# Patient Record
Sex: Male | Born: 1975 | Race: Black or African American | Hispanic: No | Marital: Married | Smoking: Never smoker
Health system: Southern US, Community
[De-identification: ages and names within clinical notes are randomized; demographics above are authoritative.]

## PROBLEM LIST (undated history)

## (undated) DIAGNOSIS — Z992 Dependence on renal dialysis: Secondary | ICD-10-CM

## (undated) DIAGNOSIS — N183 Chronic kidney disease, stage 3 (moderate): Secondary | ICD-10-CM

## (undated) DIAGNOSIS — I10 Essential (primary) hypertension: Secondary | ICD-10-CM

## (undated) DIAGNOSIS — I639 Cerebral infarction, unspecified: Secondary | ICD-10-CM

---

## 2003-02-07 ENCOUNTER — Emergency Department (HOSPITAL_COMMUNITY): Admission: EM | Admit: 2003-02-07 | Discharge: 2003-02-07 | Payer: Self-pay | Admitting: Emergency Medicine

## 2003-02-07 ENCOUNTER — Encounter: Payer: Self-pay | Admitting: Emergency Medicine

## 2006-12-03 ENCOUNTER — Emergency Department: Payer: Self-pay | Admitting: Emergency Medicine

## 2006-12-31 ENCOUNTER — Encounter: Payer: Self-pay | Admitting: Family Medicine

## 2007-01-20 ENCOUNTER — Encounter: Payer: Self-pay | Admitting: Family Medicine

## 2013-05-21 DIAGNOSIS — I10 Essential (primary) hypertension: Secondary | ICD-10-CM

## 2013-05-21 HISTORY — DX: Essential (primary) hypertension: I10

## 2014-03-13 ENCOUNTER — Emergency Department: Payer: Self-pay | Admitting: Emergency Medicine

## 2014-03-13 LAB — RAPID INFLUENZA A&B ANTIGENS

## 2014-05-21 DIAGNOSIS — I639 Cerebral infarction, unspecified: Secondary | ICD-10-CM

## 2014-05-21 HISTORY — DX: Cerebral infarction, unspecified: I63.9

## 2016-12-13 ENCOUNTER — Emergency Department (HOSPITAL_COMMUNITY): Payer: Self-pay

## 2016-12-13 ENCOUNTER — Inpatient Hospital Stay (HOSPITAL_COMMUNITY)
Admission: EM | Admit: 2016-12-13 | Discharge: 2016-12-16 | DRG: 194 | Disposition: A | Discharge status: Home / Self Care | Payer: Self-pay | Attending: Internal Medicine | Admitting: Internal Medicine

## 2016-12-13 ENCOUNTER — Encounter (HOSPITAL_COMMUNITY): Payer: Self-pay | Admitting: Emergency Medicine

## 2016-12-13 DIAGNOSIS — N183 Chronic kidney disease, stage 3 (moderate): Secondary | ICD-10-CM | POA: Diagnosis present

## 2016-12-13 DIAGNOSIS — N184 Chronic kidney disease, stage 4 (severe): Secondary | ICD-10-CM | POA: Diagnosis present

## 2016-12-13 DIAGNOSIS — Z9114 Patient's other noncompliance with medication regimen: Secondary | ICD-10-CM

## 2016-12-13 DIAGNOSIS — N179 Acute kidney failure, unspecified: Secondary | ICD-10-CM | POA: Insufficient documentation

## 2016-12-13 DIAGNOSIS — D649 Anemia, unspecified: Secondary | ICD-10-CM | POA: Diagnosis present

## 2016-12-13 DIAGNOSIS — I129 Hypertensive chronic kidney disease with stage 1 through stage 4 chronic kidney disease, or unspecified chronic kidney disease: Secondary | ICD-10-CM | POA: Diagnosis present

## 2016-12-13 DIAGNOSIS — J181 Lobar pneumonia, unspecified organism: Secondary | ICD-10-CM

## 2016-12-13 DIAGNOSIS — I10 Essential (primary) hypertension: Secondary | ICD-10-CM | POA: Diagnosis present

## 2016-12-13 DIAGNOSIS — Z8673 Personal history of transient ischemic attack (TIA), and cerebral infarction without residual deficits: Secondary | ICD-10-CM

## 2016-12-13 DIAGNOSIS — Z79899 Other long term (current) drug therapy: Secondary | ICD-10-CM

## 2016-12-13 DIAGNOSIS — E538 Deficiency of other specified B group vitamins: Secondary | ICD-10-CM | POA: Diagnosis present

## 2016-12-13 DIAGNOSIS — J189 Pneumonia, unspecified organism: Secondary | ICD-10-CM | POA: Insufficient documentation

## 2016-12-13 DIAGNOSIS — R748 Abnormal levels of other serum enzymes: Secondary | ICD-10-CM | POA: Diagnosis present

## 2016-12-13 HISTORY — DX: Cerebral infarction, unspecified: I63.9

## 2016-12-13 HISTORY — DX: Chronic kidney disease, stage 3 (moderate): N18.3

## 2016-12-13 HISTORY — DX: Essential (primary) hypertension: I10

## 2016-12-13 LAB — CBC WITH DIFFERENTIAL/PLATELET
BASOS ABS: 0 10*3/uL (ref 0.0–0.1)
Basophils Relative: 1 %
Eosinophils Absolute: 0.2 10*3/uL (ref 0.0–0.7)
Eosinophils Relative: 3 %
HCT: 36.4 % — ABNORMAL LOW (ref 39.0–52.0)
HEMOGLOBIN: 11.8 g/dL — AB (ref 13.0–17.0)
LYMPHS PCT: 31 %
Lymphs Abs: 1.9 10*3/uL (ref 0.7–4.0)
MCH: 25.8 pg — ABNORMAL LOW (ref 26.0–34.0)
MCHC: 32.4 g/dL (ref 30.0–36.0)
MCV: 79.6 fL (ref 78.0–100.0)
Monocytes Absolute: 0.4 10*3/uL (ref 0.1–1.0)
Monocytes Relative: 6 %
NEUTROS ABS: 3.6 10*3/uL (ref 1.7–7.7)
NEUTROS PCT: 59 %
PLATELETS: 262 10*3/uL (ref 150–400)
RBC: 4.57 MIL/uL (ref 4.22–5.81)
RDW: 12.9 % (ref 11.5–15.5)
WBC: 6.1 10*3/uL (ref 4.0–10.5)

## 2016-12-13 LAB — URINALYSIS, ROUTINE W REFLEX MICROSCOPIC
Bacteria, UA: NONE SEEN
Bilirubin Urine: NEGATIVE
GLUCOSE, UA: NEGATIVE mg/dL
KETONES UR: NEGATIVE mg/dL
Leukocytes, UA: NEGATIVE
NITRITE: NEGATIVE
Protein, ur: >=300 mg/dL — AB
Specific Gravity, Urine: 1.011 (ref 1.005–1.030)
Squamous Epithelial / LPF: NONE SEEN
pH: 6 (ref 5.0–8.0)

## 2016-12-13 LAB — HEPATIC FUNCTION PANEL
ALBUMIN: 3.4 g/dL — AB (ref 3.5–5.0)
ALK PHOS: 73 U/L (ref 38–126)
ALT: 33 U/L (ref 17–63)
AST: 31 U/L (ref 15–41)
BILIRUBIN DIRECT: 0.1 mg/dL (ref 0.1–0.5)
BILIRUBIN TOTAL: 0.7 mg/dL (ref 0.3–1.2)
Indirect Bilirubin: 0.6 mg/dL (ref 0.3–0.9)
Total Protein: 7.6 g/dL (ref 6.5–8.1)

## 2016-12-13 LAB — BASIC METABOLIC PANEL
ANION GAP: 8 (ref 5–15)
BUN: 26 mg/dL — ABNORMAL HIGH (ref 6–20)
CO2: 22 mmol/L (ref 22–32)
Calcium: 8.8 mg/dL — ABNORMAL LOW (ref 8.9–10.3)
Chloride: 108 mmol/L (ref 101–111)
Creatinine, Ser: 3.3 mg/dL — ABNORMAL HIGH (ref 0.61–1.24)
GFR calc non Af Amer: 21 mL/min — ABNORMAL LOW (ref >60)
GFR, EST AFRICAN AMERICAN: 25 mL/min — AB (ref >60)
Glucose, Bld: 106 mg/dL — ABNORMAL HIGH (ref 65–99)
POTASSIUM: 3.6 mmol/L (ref 3.5–5.1)
Sodium: 138 mmol/L (ref 135–145)

## 2016-12-13 LAB — CK: Total CK: 503 U/L — ABNORMAL HIGH (ref 49–397)

## 2016-12-13 LAB — BRAIN NATRIURETIC PEPTIDE: B Natriuretic Peptide: 17 pg/mL (ref 0.0–100.0)

## 2016-12-13 LAB — TROPONIN I: Troponin I: 0.03 ng/mL (ref <0.03)

## 2016-12-13 MED ORDER — SODIUM CHLORIDE 0.9 % IV BOLUS (SEPSIS)
1000.0000 mL | Freq: Once | INTRAVENOUS | Status: AC
  Administered 2016-12-14: 1000 mL via INTRAVENOUS

## 2016-12-13 MED ORDER — DEXTROSE 5 % IV SOLN
1.0000 g | Freq: Once | INTRAVENOUS | Status: AC
  Administered 2016-12-13: 1 g via INTRAVENOUS
  Filled 2016-12-13: qty 10

## 2016-12-13 MED ORDER — AZITHROMYCIN 500 MG IV SOLR
500.0000 mg | Freq: Once | INTRAVENOUS | Status: AC
  Administered 2016-12-13: 500 mg via INTRAVENOUS
  Filled 2016-12-13: qty 500

## 2016-12-13 MED ORDER — SODIUM CHLORIDE 0.9 % IV BOLUS (SEPSIS)
1000.0000 mL | Freq: Once | INTRAVENOUS | Status: AC
Start: 2016-12-13 — End: 2016-12-13
  Administered 2016-12-13: 1000 mL via INTRAVENOUS

## 2016-12-13 NOTE — ED Triage Notes (Signed)
Pt c/o generalized weakness/fatigue and body aches x 2 days. Denies n/v/d.

## 2016-12-13 NOTE — ED Notes (Signed)
Pt ambulatory to restroom

## 2016-12-13 NOTE — ED Notes (Signed)
Pt given graham crackers.

## 2016-12-13 NOTE — H&P (Signed)
TRH H&P    Patient Demographics:    Dominic Henry, is a 41 y.o. male  MRN: [id number]  DOB - Sep 22, 1975  Admit Date - 12/13/2016  Referring MD/NP/PA: Julianne Rice  Outpatient Primary MD for the patient is Cletis Athens, MD  Patient coming from: Home  Chief Complaint  Patient presents with  . Weakness      HPI:    Dominic Henry  is a 41 y.o. male, With history of hypertension, noncompliant with his medications him to hospital with cough and fever of  2 days duration. Patient says that he has been coughing up yellow colored phlegm also had shortness of breath. Complains of mild central chest pain. Denies nausea vomiting or diarrhea. No abdominal pain. No dysuria.  In the ED, chest x-ray showed evidence of left lower lobe pneumonia. Patient started on ceftriaxone and Zithromax. Lab work also showed creatinine of 3.3, last creatinine from 2016 showed acute kidney injury with creatinine of 2.2 which improved a 1.7 after IV fluids.   Review of systems:    .  All other systems reviewed and are negative.   With Past History of the following :    Past Medical History:  Diagnosis Date  . Hypertension   . Stroke [facility])       History reviewed. No pertinent surgical history.    Social History:      Social History  Substance Use Topics  . Smoking status: Never Smoker  . Smokeless tobacco: Never Used  . Alcohol use No       Family History :   Patient's grand mother had heart problems   Home Medications:   Prior to Admission medications   Medication Sig Start Date End Date Taking? Authorizing Provider  diphenhydrAMINE (BENADRYL) 25 MG tablet Take 25 mg by mouth every 6 (six) hours as needed for itching or allergies.   Yes [provider]  metoprolol succinate (TOPROL-XL) 100 MG 24 hr tablet Take 100 mg by mouth daily. Take with or immediately following a meal.    [provider]  UNKNOWN TO PATIENT Take 1 tablet by mouth every evening. Unknown blood pressure medication    [provider]     Allergies:    No Known Allergies   Physical Exam:   Vitals  Blood pressure (!) 151/98, pulse 89, temperature 99.3 F (37.4 C), temperature source Oral, resp. rate 17, height 5\' 9"  (1.753 m), weight 113.4 kg (250 lb), SpO2 96 %.  1.  General: Appears in no acute distress  2. Psychiatric:  Intact judgement and  insight, awake alert, oriented x 3.  3. Neurologic: No focal neurological deficits, all cranial nerves intact.Strength 5/5 all 4 extremities, sensation intact all 4 extremities, plantars down going.  4. Eyes :  anicteric sclerae, moist conjunctivae with no lid lag. PERRLA.  5. ENMT:  Oropharynx clear with moist mucous membranes and good dentition  6. Neck:  supple, no cervical lymphadenopathy appriciated, No thyromegaly  7. Respiratory : Normal respiratory effort, good air movement bilaterally,clear to  auscultation  bilaterally  8. Cardiovascular : RRR, no gallops, rubs or murmurs, no leg edema  9. Gastrointestinal:  Positive bowel sounds, abdomen soft, non-tender to palpation,no hepatosplenomegaly, no rigidity or guarding       10. Skin:  No cyanosis, normal texture and turgor, no rash, lesions or ulcers  11.Musculoskeletal:  Good muscle tone,  joints appear normal , no effusions,  normal range of motion    Data Review:    CBC  Recent Labs Lab 12/13/16 1718  WBC 6.1  HGB 11.8*  HCT 36.4*  PLT 262  MCV 79.6  MCH 25.8*  MCHC 32.4  RDW 12.9  LYMPHSABS 1.9  MONOABS 0.4  EOSABS 0.2  BASOSABS 0.0   ------------------------------------------------------------------------------------------------------------------  Chemistries   Recent Labs Lab 12/13/16 1718 12/13/16 1725  NA 138  --   K 3.6  --   CL 108  --   CO2 22  --   GLUCOSE 106*  --   BUN 26*  --   CREATININE 3.30*  --   CALCIUM 8.8*  --     AST  --  31  ALT  --  33  ALKPHOS  --  73  BILITOT  --  0.7   ------------------------------------------------------------------------------------------------------------------  ------------------------------------------------------------------------------------------------------------------ GFR: Estimated Creatinine Clearance: 35.8 mL/min (A) (by C-G formula based on SCr of 3.3 mg/dL (H)). Liver Function Tests:  Recent Labs Lab 12/13/16 1725  AST 31  ALT 33  ALKPHOS 73  BILITOT 0.7  PROT 7.6  ALBUMIN 3.4*   Cardiac Enzymes:  Recent Labs Lab 12/13/16 1725  CKTOTAL 503*  TROPONINI 0.03*    --------------------------------------------------------------------------------------------------------------- Urine analysis:    Component Value Date/Time   COLORURINE YELLOW 12/13/2016 1730   APPEARANCEUR CLEAR 12/13/2016 1730   LABSPEC 1.011 12/13/2016 1730   PHURINE 6.0 12/13/2016 1730   GLUCOSEU NEGATIVE 12/13/2016 1730   HGBUR SMALL (A) 12/13/2016 1730   BILIRUBINUR NEGATIVE 12/13/2016 1730   KETONESUR NEGATIVE 12/13/2016 1730   PROTEINUR >=300 (A) 12/13/2016 1730   NITRITE NEGATIVE 12/13/2016 1730   LEUKOCYTESUR NEGATIVE 12/13/2016 1730      Imaging Results:    Dg Chest 2 View  Result Date: 12/13/2016 CLINICAL DATA:  Productive cough, shortness of breath, and mid upper chest pain with cough for 2 days. History of hypertension. EXAM: CHEST  2 VIEW COMPARISON:  None. FINDINGS: Linear infiltration in the left lung base posteriorly likely representing pneumonia. Shallow inspiration. Heart size and pulmonary vascularity are normal. Mediastinal contours appear intact. Right lung is clear. IMPRESSION: Linear infiltrates in the left lung base likely representing pneumonia. Followup PA and lateral chest X-ray is recommended in 3-4 weeks following appropriate clinical therapy to ensure resolution and exclude underlying malignancy. Electronically Signed   By: Lucienne Capers M.D.    On: 12/13/2016 20:19    My personal review of EKG: Rhythm NSR   Assessment & Plan:    Active Problems:   CAP (community acquired pneumonia)   AKI (acute kidney injury) ([place])   Essential hypertension   1. Community-acquired pneumonia- we'll start ceftriaxone and Zithromax. Follow blood cultures result. Urinary strep pneumo antigen 2. Chest pain- mild chest pain, has elevated CK and troponin 0.03. We'll cycle troponin every 6 hours 3. EKG shows normal sinus rhythm. Echocardiogram in a.m. 3. Hypertension- patient has been noncompliant with his medications. Hasn't taken his medication since June. Continue Toprol-XL 100 mg by mouth daily. 4. Acute kidney injury/? Chronic kidney disease stage III- continue gentle IV hydration with normal saline at 75 ML  per hour. Follow BMP in a.m. Consider nephrology consultation in a.m. if no improvement in creatinine.   DVT Prophylaxis-   Lovenox   AM Labs Ordered, also please review Full Orders  Family Communication: Admission, patients condition and plan of care including tests being ordered have been discussed with the patient  who indicate understanding and agree with the plan and Code Status.  Code Status:  Full code  Admission status: Observation    Time spent in minutes : 60 minutes   Waco Foerster S M.D on 12/13/2016 at 11:52 PM  Between 7am to 7pm - Pager - [phone]. After 7pm go to [email or web address] - password [employer]  [facility] - Office  [phone]

## 2016-12-13 NOTE — ED Provider Notes (Signed)
[place] DEPT Provider Note   CSN: [id number] Arrival date & time: 12/13/16  1645     History   Chief Complaint Chief Complaint  Patient presents with  . Weakness    HPI Moss A Mcghee is a 41 y.o. male.  HPI Patient presents with several days of cough with dyspnea on exertion. He's had subjective fevers and chills as well as diaphoresis. Complains of diffuse muscular pain to back and lower extremities. No abdominal pain, nausea or vomiting. States his urine is dark in color. No known sick contacts. Patient does have some mild central chest pain. States the pain is worse with deep breathing or movement. No radiation. Past Medical History:  Diagnosis Date  . Hypertension   . Stroke [facility])     There are no active problems to display for this patient.   History reviewed. No pertinent surgical history.     Home Medications    Prior to Admission medications   Medication Sig Start Date End Date Taking? Authorizing Provider  diphenhydrAMINE (BENADRYL) 25 MG tablet Take 25 mg by mouth every 6 (six) hours as needed for itching or allergies.   Yes [provider]  metoprolol succinate (TOPROL-XL) 100 MG 24 hr tablet Take 100 mg by mouth daily. Take with or immediately following a meal.    [provider]  UNKNOWN TO PATIENT Take 1 tablet by mouth every evening. Unknown blood pressure medication    [provider]    Family History No family history on file.  Social History Social History  Substance Use Topics  . Smoking status: Never Smoker  . Smokeless tobacco: Never Used  . Alcohol use No     Allergies   Patient has no known allergies.   Review of Systems Review of Systems  Constitutional: Positive for chills, diaphoresis, fatigue and fever.  HENT: Negative for congestion, sinus pressure and sore throat.   Eyes: Negative for visual disturbance.  Respiratory: Positive for cough and shortness of breath. Negative for chest  tightness and wheezing.   Cardiovascular: Positive for chest pain. Negative for palpitations and leg swelling.  Gastrointestinal: Negative for abdominal pain, constipation, diarrhea, nausea and vomiting.  Genitourinary: Negative for dysuria, flank pain and frequency.  Musculoskeletal: Positive for back pain and myalgias. Negative for arthralgias, neck pain and neck stiffness.  Skin: Negative for rash and wound.  Neurological: Negative for dizziness, weakness, light-headedness, numbness and headaches.  All other systems reviewed and are negative.    Physical Exam Updated Vital Signs BP (!) 164/114 (BP Location: Right Arm)   Pulse 93   Temp 99.3 F (37.4 C) (Oral)   Resp 18   Ht 5\' 9"  (1.753 m)   Wt 113.4 kg (250 lb)   SpO2 97%   BMI 36.92 kg/m   Physical Exam  Constitutional: He is oriented to person, place, and time. He appears well-developed and well-nourished. No distress.  HENT:  Head: Normocephalic and atraumatic.  Mouth/Throat: Oropharynx is clear and moist. No oropharyngeal exudate.  Eyes: Pupils are equal, round, and reactive to light. Conjunctivae and EOM are normal. No scleral icterus.  Neck: Normal range of motion. Neck supple. No thyromegaly present.  No meningismus  Cardiovascular: Normal rate and regular rhythm.  Exam reveals no gallop and no friction rub.   No murmur heard. Pulmonary/Chest: Effort normal. He exhibits tenderness.  Patient has some diminished breath sounds in bilateral bases. Central chest pain is reproduced with palpation. There is no crepitance or deformity.  Abdominal: Soft.  Bowel sounds are normal. There is no tenderness. There is no rebound and no guarding.  Musculoskeletal: Normal range of motion. He exhibits no edema or tenderness.  No midline thoracic or lumbar tenderness. Patient does have diffuse thoracic and lumbar muscular tenderness to palpation as well as diffuse bilateral lower extremity tenderness to palpation. There is no asymmetry  or Swelling. 2+ distal pulses.  Lymphadenopathy:    He has no cervical adenopathy.  Neurological: He is alert and oriented to person, place, and time.  5/5 motor. Sensation fully intact.  Skin: Skin is warm and dry. Capillary refill takes less than 2 seconds. No rash noted. He is not diaphoretic. No erythema.  Psychiatric: He has a normal mood and affect. His behavior is normal.  Nursing note and vitals reviewed.    ED Treatments / Results  Labs (all labs ordered are listed, but only abnormal results are displayed) Labs Reviewed  CBC WITH DIFFERENTIAL/PLATELET - Abnormal; Notable for the following:       Result Value   Hemoglobin 11.8 (*)    HCT 36.4 (*)    MCH 25.8 (*)    All other components within normal limits  BASIC METABOLIC PANEL - Abnormal; Notable for the following:    Glucose, Bld 106 (*)    BUN 26 (*)    Creatinine, Ser 3.30 (*)    Calcium 8.8 (*)    GFR calc non Af Amer 21 (*)    GFR calc Af Amer 25 (*)    All other components within normal limits  URINALYSIS, ROUTINE W REFLEX MICROSCOPIC - Abnormal; Notable for the following:    Hgb urine dipstick SMALL (*)    Protein, ur >=300 (*)    All other components within normal limits  HEPATIC FUNCTION PANEL - Abnormal; Notable for the following:    Albumin 3.4 (*)    All other components within normal limits  TROPONIN I - Abnormal; Notable for the following:    Troponin I 0.03 (*)    All other components within normal limits  CK - Abnormal; Notable for the following:    Total CK 503 (*)    All other components within normal limits  BRAIN NATRIURETIC PEPTIDE    EKG  EKG Interpretation None       Radiology Dg Chest 2 View  Result Date: 12/13/2016 CLINICAL DATA:  Productive cough, shortness of breath, and mid upper chest pain with cough for 2 days. History of hypertension. EXAM: CHEST  2 VIEW COMPARISON:  None. FINDINGS: Linear infiltration in the left lung base posteriorly likely representing pneumonia.  Shallow inspiration. Heart size and pulmonary vascularity are normal. Mediastinal contours appear intact. Right lung is clear. IMPRESSION: Linear infiltrates in the left lung base likely representing pneumonia. Followup PA and lateral chest X-ray is recommended in 3-4 weeks following appropriate clinical therapy to ensure resolution and exclude underlying malignancy. Electronically Signed   By: Lucienne Capers M.D.   On: 12/13/2016 20:19    Procedures Procedures (including critical care time)  Medications Ordered in ED Medications  sodium chloride 0.9 % bolus 1,000 mL (not administered)  azithromycin (ZITHROMAX) 500 mg in dextrose 5 % 250 mL IVPB (not administered)  sodium chloride 0.9 % bolus 1,000 mL (1,000 mLs Intravenous New Bag/Given 12/13/16 2049)  cefTRIAXone (ROCEPHIN) 1 g in dextrose 5 % 50 mL IVPB (1 g Intravenous New Bag/Given 12/13/16 2051)     Initial Impression / Assessment and Plan / ED Course  I have reviewed the triage  vital signs and the nursing notes.  Pertinent labs & imaging results that were available during my care of the patient were reviewed by me and considered in my medical decision making (see chart for details).    Retrieved from [facility]. Admitted in 2016 and found to have acute kidney injury with creatinine of 2.2 which improved to 1.7 after IV fluids. Had renal ultrasound the time without obvious abnormality. Patient has not followed up with a nephrologist.  X-ray with evidence of left lower lobe pneumonia. Given IV fluids and started on antibiotics. Discussed with hospitalist will see patient in emergency department. Final Clinical Impressions(s) / ED Diagnoses   Final diagnoses:  Community acquired pneumonia of right middle lobe of lung ([place])  AKI (acute kidney injury) ([place])    New Prescriptions New Prescriptions   No medications on file     Julianne Rice, MD 12/13/16 2139

## 2016-12-14 ENCOUNTER — Encounter (HOSPITAL_COMMUNITY): Payer: Self-pay

## 2016-12-14 ENCOUNTER — Observation Stay (HOSPITAL_COMMUNITY): Payer: Self-pay

## 2016-12-14 DIAGNOSIS — D649 Anemia, unspecified: Secondary | ICD-10-CM

## 2016-12-14 DIAGNOSIS — R071 Chest pain on breathing: Secondary | ICD-10-CM

## 2016-12-14 LAB — ECHOCARDIOGRAM COMPLETE
Height: 69 in
Weight: 4190.5 oz

## 2016-12-14 LAB — COMPREHENSIVE METABOLIC PANEL
ALBUMIN: 3 g/dL — AB (ref 3.5–5.0)
ALK PHOS: 75 U/L (ref 38–126)
ALT: 35 U/L (ref 17–63)
AST: 31 U/L (ref 15–41)
Anion gap: 8 (ref 5–15)
BUN: 24 mg/dL — ABNORMAL HIGH (ref 6–20)
CALCIUM: 8.1 mg/dL — AB (ref 8.9–10.3)
CHLORIDE: 107 mmol/L (ref 101–111)
CO2: 21 mmol/L — AB (ref 22–32)
CREATININE: 2.96 mg/dL — AB (ref 0.61–1.24)
GFR calc Af Amer: 28 mL/min — ABNORMAL LOW (ref >60)
GFR calc non Af Amer: 24 mL/min — ABNORMAL LOW (ref >60)
GLUCOSE: 113 mg/dL — AB (ref 65–99)
Potassium: 3.7 mmol/L (ref 3.5–5.1)
SODIUM: 136 mmol/L (ref 135–145)
Total Bilirubin: 0.7 mg/dL (ref 0.3–1.2)
Total Protein: 7 g/dL (ref 6.5–8.1)

## 2016-12-14 LAB — CBC
HCT: 34.5 % — ABNORMAL LOW (ref 39.0–52.0)
HEMOGLOBIN: 11.2 g/dL — AB (ref 13.0–17.0)
MCH: 26.2 pg (ref 26.0–34.0)
MCHC: 32.5 g/dL (ref 30.0–36.0)
MCV: 80.6 fL (ref 78.0–100.0)
PLATELETS: 244 10*3/uL (ref 150–400)
RBC: 4.28 MIL/uL (ref 4.22–5.81)
RDW: 13 % (ref 11.5–15.5)
WBC: 4.5 10*3/uL (ref 4.0–10.5)

## 2016-12-14 LAB — STREP PNEUMONIAE URINARY ANTIGEN: STREP PNEUMO URINARY ANTIGEN: NEGATIVE

## 2016-12-14 LAB — EXPECTORATED SPUTUM ASSESSMENT W GRAM STAIN, RFLX TO RESP C

## 2016-12-14 LAB — CK: CK TOTAL: 545 U/L — AB (ref 49–397)

## 2016-12-14 LAB — TROPONIN I
TROPONIN I: 0.03 ng/mL — AB (ref <0.03)
TROPONIN I: 0.03 ng/mL — AB (ref <0.03)
Troponin I: 0.03 ng/mL (ref <0.03)

## 2016-12-14 LAB — EXPECTORATED SPUTUM ASSESSMENT W REFEX TO RESP CULTURE

## 2016-12-14 LAB — MRSA PCR SCREENING: MRSA by PCR: NEGATIVE

## 2016-12-14 MED ORDER — HYDRALAZINE HCL 20 MG/ML IJ SOLN
10.0000 mg | INTRAMUSCULAR | Status: DC | PRN
  Administered 2016-12-14 (×2): 10 mg via INTRAVENOUS
  Filled 2016-12-14 (×2): qty 1

## 2016-12-14 MED ORDER — HYDRALAZINE HCL 25 MG PO TABS
25.0000 mg | ORAL_TABLET | Freq: Four times a day (QID) | ORAL | Status: DC | PRN
  Administered 2016-12-14: 25 mg via ORAL
  Filled 2016-12-14: qty 1

## 2016-12-14 MED ORDER — DEXTROSE 5 % IV SOLN
500.0000 mg | INTRAVENOUS | Status: DC
  Administered 2016-12-15 (×2): 500 mg via INTRAVENOUS
  Filled 2016-12-14 (×3): qty 500

## 2016-12-14 MED ORDER — METOPROLOL SUCCINATE ER 50 MG PO TB24
100.0000 mg | ORAL_TABLET | Freq: Every day | ORAL | Status: DC
  Administered 2016-12-14 – 2016-12-16 (×4): 100 mg via ORAL
  Filled 2016-12-14 (×4): qty 2

## 2016-12-14 MED ORDER — DEXTROSE 5 % IV SOLN
1.0000 g | INTRAVENOUS | Status: DC
  Administered 2016-12-15 (×2): 1 g via INTRAVENOUS
  Filled 2016-12-14 (×3): qty 10

## 2016-12-14 MED ORDER — HYDROCODONE-ACETAMINOPHEN 5-325 MG PO TABS
1.0000 | ORAL_TABLET | ORAL | Status: DC | PRN
  Administered 2016-12-14: 1 via ORAL
  Filled 2016-12-14: qty 1

## 2016-12-14 MED ORDER — BENZONATATE 100 MG PO CAPS: 100.0000 mg | ORAL_CAPSULE | Freq: Three times a day (TID) | ORAL | Status: DC | PRN

## 2016-12-14 MED ORDER — GUAIFENESIN-DM 100-10 MG/5ML PO SYRP: 5.0000 mL | ORAL_SOLUTION | ORAL | Status: DC | PRN

## 2016-12-14 MED ORDER — ALBUTEROL SULFATE (2.5 MG/3ML) 0.083% IN NEBU: 2.5000 mg | INHALATION_SOLUTION | Freq: Four times a day (QID) | RESPIRATORY_TRACT | Status: DC | PRN

## 2016-12-14 MED ORDER — ACETAMINOPHEN 325 MG PO TABS
650.0000 mg | ORAL_TABLET | Freq: Four times a day (QID) | ORAL | Status: DC | PRN
  Administered 2016-12-14: 650 mg via ORAL
  Filled 2016-12-14: qty 2

## 2016-12-14 MED ORDER — ENOXAPARIN SODIUM 30 MG/0.3ML ~~LOC~~ SOLN
30.0000 mg | SUBCUTANEOUS | Status: DC
  Administered 2016-12-14: 30 mg via SUBCUTANEOUS
  Filled 2016-12-14: qty 0.3

## 2016-12-14 MED ORDER — SODIUM CHLORIDE 0.9 % IV SOLN
INTRAVENOUS | Status: DC
  Administered 2016-12-14: 01:00:00 via INTRAVENOUS
  Administered 2016-12-15: 65 mL/h via INTRAVENOUS
  Administered 2016-12-16: 09:00:00 via INTRAVENOUS

## 2016-12-14 MED ORDER — ENOXAPARIN SODIUM 40 MG/0.4ML ~~LOC~~ SOLN
40.0000 mg | SUBCUTANEOUS | Status: DC
  Filled 2016-12-14: qty 0.4

## 2016-12-14 NOTE — Progress Notes (Signed)
*  PRELIMINARY RESULTS* Echocardiogram 2D Echocardiogram has been performed.  Leavy Cella 12/14/2016, 11:58 AM
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
## 2016-12-14 NOTE — Clinical Social Work Note (Signed)
CSW consulted for patient's not having a PCP. This is a CM function. LCSW to make CM aware of need and referral will be addressed appropriately.      Delta Pichon, Clydene Pugh, LCSW

## 2016-12-14 NOTE — Progress Notes (Signed)
Patient is up ad lib. IV patent. Family visiting at the bedside. No complaints of any distress. Report given to [place], RN upstairs. Patient to be transferred via wheelchair to room 340.

## 2016-12-14 NOTE — Care Management Note (Signed)
Case Management Note  Patient Details  Name: Dominic Henry MRN: [id number] Date of Birth: 16-Oct-1975  Subjective/Objective:    Adm with PNA/AKI. From home, ind PTA. Patient is employed and has insurance, girlfriend is to bring insurance card. Patient reports that he has prescription coverage. He does not have a current PCP as he has not been to see a doctor in quite awhile. Patient now lives in [place]. CM provided list of PCP providers, that includes [facility].           Action/Plan: Plans to return home with self care. No other CM needs.    Expected Discharge Date:     12/16/2016             Expected Discharge Plan:  Home/Self Care  In-House Referral:     Discharge planning Services  CM Consult  Post Acute Care Choice:  NA Choice offered to:  NA  DME Arranged:    DME Agency:     HH Arranged:    HH Agency:     Status of Service:  Completed, signed off  If discussed at [employer] of Stay Meetings, dates discussed:    Additional Comments:  [name], Chauncey Reading, RN 12/14/2016, 1:03 PM

## 2016-12-15 ENCOUNTER — Encounter (HOSPITAL_COMMUNITY): Payer: Self-pay | Admitting: *Deleted

## 2016-12-15 DIAGNOSIS — E538 Deficiency of other specified B group vitamins: Secondary | ICD-10-CM | POA: Diagnosis present

## 2016-12-15 LAB — BASIC METABOLIC PANEL
ANION GAP: 8 (ref 5–15)
BUN: 21 mg/dL — AB (ref 6–20)
CALCIUM: 8.3 mg/dL — AB (ref 8.9–10.3)
CO2: 22 mmol/L (ref 22–32)
CREATININE: 2.55 mg/dL — AB (ref 0.61–1.24)
Chloride: 107 mmol/L (ref 101–111)
GFR calc Af Amer: 34 mL/min — ABNORMAL LOW (ref >60)
GFR, EST NON AFRICAN AMERICAN: 29 mL/min — AB (ref >60)
Glucose, Bld: 116 mg/dL — ABNORMAL HIGH (ref 65–99)
POTASSIUM: 3.5 mmol/L (ref 3.5–5.1)
Sodium: 137 mmol/L (ref 135–145)

## 2016-12-15 LAB — VITAMIN B12: VITAMIN B 12: 141 pg/mL — AB (ref 180–914)

## 2016-12-15 LAB — IRON AND TIBC
Iron: 35 ug/dL — ABNORMAL LOW (ref 45–182)
Saturation Ratios: 14 % — ABNORMAL LOW (ref 17.9–39.5)
TIBC: 245 ug/dL — ABNORMAL LOW (ref 250–450)
UIBC: 210 ug/dL

## 2016-12-15 LAB — HIV ANTIBODY (ROUTINE TESTING W REFLEX): HIV SCREEN 4TH GENERATION: NONREACTIVE

## 2016-12-15 LAB — CBC
HEMATOCRIT: 35.6 % — AB (ref 39.0–52.0)
Hemoglobin: 11.6 g/dL — ABNORMAL LOW (ref 13.0–17.0)
MCH: 26.1 pg (ref 26.0–34.0)
MCHC: 32.6 g/dL (ref 30.0–36.0)
MCV: 80.2 fL (ref 78.0–100.0)
PLATELETS: 254 10*3/uL (ref 150–400)
RBC: 4.44 MIL/uL (ref 4.22–5.81)
RDW: 13 % (ref 11.5–15.5)
WBC: 4.9 10*3/uL (ref 4.0–10.5)

## 2016-12-15 LAB — TSH: TSH: 1.813 u[IU]/mL (ref 0.350–4.500)

## 2016-12-15 LAB — FERRITIN: Ferritin: 136 ng/mL (ref 24–336)

## 2016-12-15 MED ORDER — POTASSIUM CHLORIDE CRYS ER 20 MEQ PO TBCR
40.0000 meq | EXTENDED_RELEASE_TABLET | Freq: Once | ORAL | Status: AC
  Administered 2016-12-15: 40 meq via ORAL
  Filled 2016-12-15: qty 2

## 2016-12-15 MED ORDER — AMLODIPINE BESYLATE 5 MG PO TABS
2.5000 mg | ORAL_TABLET | Freq: Every day | ORAL | Status: DC
  Administered 2016-12-15 – 2016-12-16 (×2): 2.5 mg via ORAL
  Filled 2016-12-15 (×2): qty 1

## 2016-12-15 MED ORDER — CYANOCOBALAMIN 1000 MCG/ML IJ SOLN
1000.0000 ug | Freq: Every day | INTRAMUSCULAR | Status: AC
  Administered 2016-12-15 – 2016-12-16 (×2): 1000 ug via INTRAMUSCULAR
  Filled 2016-12-15 (×2): qty 1
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
## 2016-12-16 ENCOUNTER — Encounter (HOSPITAL_COMMUNITY): Payer: Self-pay

## 2016-12-16 DIAGNOSIS — N184 Chronic kidney disease, stage 4 (severe): Secondary | ICD-10-CM | POA: Diagnosis present

## 2016-12-16 DIAGNOSIS — N183 Chronic kidney disease, stage 3 unspecified: Secondary | ICD-10-CM

## 2016-12-16 HISTORY — DX: Chronic kidney disease, stage 3 unspecified: N18.30

## 2016-12-16 HISTORY — DX: Chronic kidney disease, stage 4 (severe): N18.4

## 2016-12-16 LAB — CULTURE, RESPIRATORY W GRAM STAIN: Culture: NORMAL

## 2016-12-16 LAB — BASIC METABOLIC PANEL
ANION GAP: 7 (ref 5–15)
BUN: 20 mg/dL (ref 6–20)
CALCIUM: 8.7 mg/dL — AB (ref 8.9–10.3)
CO2: 23 mmol/L (ref 22–32)
CREATININE: 2.61 mg/dL — AB (ref 0.61–1.24)
Chloride: 109 mmol/L (ref 101–111)
GFR calc Af Amer: 33 mL/min — ABNORMAL LOW (ref >60)
GFR calc non Af Amer: 28 mL/min — ABNORMAL LOW (ref >60)
GLUCOSE: 104 mg/dL — AB (ref 65–99)
Potassium: 4 mmol/L (ref 3.5–5.1)
Sodium: 139 mmol/L (ref 135–145)

## 2016-12-16 LAB — CULTURE, RESPIRATORY

## 2016-12-16 MED ORDER — VITAMIN B-12 1000 MCG PO TABS: 1000.0000 ug | ORAL_TABLET | Freq: Every day | ORAL | Status: DC

## 2016-12-16 MED ORDER — AMLODIPINE BESYLATE 5 MG PO TABS: 5.0000 mg | ORAL_TABLET | Freq: Every day | ORAL | Status: DC

## 2016-12-16 MED ORDER — AMLODIPINE BESYLATE 5 MG PO TABS: 5.0000 mg | ORAL_TABLET | Freq: Every day | ORAL | 1 refills | Status: DC

## 2016-12-16 MED ORDER — METOPROLOL SUCCINATE ER 100 MG PO TB24: 100.0000 mg | ORAL_TABLET | Freq: Every day | ORAL | 1 refills | Status: DC

## 2016-12-16 MED ORDER — CEFUROXIME AXETIL 500 MG PO TABS: 500.0000 mg | ORAL_TABLET | Freq: Two times a day (BID) | ORAL | 0 refills | Status: DC

## 2016-12-16 MED ORDER — BENZONATATE 100 MG PO CAPS: 100.0000 mg | ORAL_CAPSULE | Freq: Three times a day (TID) | ORAL | 0 refills | Status: DC | PRN

## 2016-12-16 MED ORDER — CEFUROXIME AXETIL 500 MG PO TABS: 500.0000 mg | ORAL_TABLET | Freq: Two times a day (BID) | ORAL | 0 refills | Status: AC

## 2016-12-16 MED ORDER — AMLODIPINE BESYLATE 5 MG PO TABS
2.5000 mg | ORAL_TABLET | Freq: Once | ORAL | Status: AC
  Administered 2016-12-16: 2.5 mg via ORAL
  Filled 2016-12-16: qty 1
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
## 2016-12-16 NOTE — Progress Notes (Signed)
IV removed, WNL. D/C instructions given to pt and family. Verbalized understanding. Pt spouse at bedside to transport home.

## 2016-12-19 LAB — CULTURE, BLOOD (ROUTINE X 2)
CULTURE: NO GROWTH
Culture: NO GROWTH
SPECIAL REQUESTS: ADEQUATE
Special Requests: ADEQUATE

## 2017-10-07 ENCOUNTER — Encounter (HOSPITAL_COMMUNITY): Payer: Self-pay | Admitting: Emergency Medicine

## 2017-10-07 ENCOUNTER — Other Ambulatory Visit: Payer: Self-pay

## 2017-10-07 ENCOUNTER — Emergency Department (HOSPITAL_COMMUNITY)
Admission: EM | Admit: 2017-10-07 | Discharge: 2017-10-07 | Disposition: A | Discharge status: Home / Self Care | Payer: Self-pay | Attending: Emergency Medicine | Admitting: Emergency Medicine

## 2017-10-07 ENCOUNTER — Emergency Department (HOSPITAL_COMMUNITY): Payer: Self-pay

## 2017-10-07 DIAGNOSIS — I129 Hypertensive chronic kidney disease with stage 1 through stage 4 chronic kidney disease, or unspecified chronic kidney disease: Secondary | ICD-10-CM | POA: Insufficient documentation

## 2017-10-07 DIAGNOSIS — J069 Acute upper respiratory infection, unspecified: Secondary | ICD-10-CM | POA: Insufficient documentation

## 2017-10-07 DIAGNOSIS — R0789 Other chest pain: Secondary | ICD-10-CM

## 2017-10-07 DIAGNOSIS — Z79899 Other long term (current) drug therapy: Secondary | ICD-10-CM | POA: Insufficient documentation

## 2017-10-07 DIAGNOSIS — N183 Chronic kidney disease, stage 3 (moderate): Secondary | ICD-10-CM | POA: Insufficient documentation

## 2017-10-07 DIAGNOSIS — J40 Bronchitis, not specified as acute or chronic: Secondary | ICD-10-CM | POA: Insufficient documentation

## 2017-10-07 MED ORDER — DOXYCYCLINE HYCLATE 100 MG PO TABS
100.0000 mg | ORAL_TABLET | Freq: Once | ORAL | Status: AC
  Administered 2017-10-07: 100 mg via ORAL
  Filled 2017-10-07: qty 1

## 2017-10-07 MED ORDER — HYDROCOD POLST-CPM POLST ER 10-8 MG/5ML PO SUER
5.0000 mL | Freq: Once | ORAL | Status: AC
  Administered 2017-10-07: 5 mL via ORAL
  Filled 2017-10-07: qty 5

## 2017-10-07 MED ORDER — HYDROCODONE-ACETAMINOPHEN 5-325 MG PO TABS: 1.0000 | ORAL_TABLET | ORAL | 0 refills | Status: DC | PRN

## 2017-10-07 MED ORDER — DOXYCYCLINE HYCLATE 100 MG PO CAPS: 100.0000 mg | ORAL_CAPSULE | Freq: Two times a day (BID) | ORAL | 0 refills | Status: DC

## 2017-10-07 MED ORDER — ALBUTEROL SULFATE (2.5 MG/3ML) 0.083% IN NEBU
2.5000 mg | INHALATION_SOLUTION | Freq: Once | RESPIRATORY_TRACT | Status: AC
  Administered 2017-10-07: 2.5 mg via RESPIRATORY_TRACT
  Filled 2017-10-07: qty 3

## 2017-10-07 MED ORDER — HYDROCODONE-HOMATROPINE 5-1.5 MG/5ML PO SYRP: 5.0000 mL | ORAL_SOLUTION | Freq: Four times a day (QID) | ORAL | 0 refills | Status: DC | PRN

## 2017-10-07 MED ORDER — IPRATROPIUM-ALBUTEROL 0.5-2.5 (3) MG/3ML IN SOLN
3.0000 mL | Freq: Once | RESPIRATORY_TRACT | Status: AC
Start: 2017-10-07 — End: 2017-10-07
  Administered 2017-10-07: 3 mL via RESPIRATORY_TRACT
  Filled 2017-10-07: qty 3

## 2017-10-07 MED ORDER — PREDNISONE 20 MG PO TABS
40.0000 mg | ORAL_TABLET | Freq: Once | ORAL | Status: AC
  Administered 2017-10-07: 40 mg via ORAL
  Filled 2017-10-07: qty 2

## 2017-10-07 MED ORDER — DEXAMETHASONE 4 MG PO TABS: 4.0000 mg | ORAL_TABLET | Freq: Two times a day (BID) | ORAL | 0 refills | Status: DC

## 2017-10-07 MED ORDER — OXYMETAZOLINE HCL 0.05 % NA SOLN
2.0000 | Freq: Once | NASAL | Status: AC
  Administered 2017-10-07: 2 via NASAL
  Filled 2017-10-07: qty 15

## 2017-10-07 NOTE — ED Notes (Signed)
Respiratory paged at this time for tx.  

## 2017-10-07 NOTE — ED Notes (Signed)
Pt ambulated with no distress down the hallway and back with O2 sat ranging from 97-100% on RA.

## 2017-10-07 NOTE — Discharge Instructions (Addendum)
Your blood pressure has been elevated throughout your emergency department visit, otherwise your vital signs have been within normal limits.  It is extremely important that given your history of strokes and kidney problems that you discuss your blood pressure with your doctor as soon as possible this week.  Your examination and your x-ray suggest bronchitis and upper respiratory infection.  It is important that you wash hands frequently.  You increase your fluids.  This is contagious, so please use your mask until symptoms have resolved.  Please use 2 squirts of Afrin in each nostril 2 times daily.  Use Decadron and doxycycline 2 times daily with food. You may use hycodan to help with cough. This medication may cause drowsiness, please use with caution. This medication may cause drowsiness. Please do not drink, drive, or participate in activity that requires concentration while taking this medication.  Please return to the emergency department for evaluation if any changes in your condition, problems, or your concerns.

## 2017-10-07 NOTE — ED Triage Notes (Signed)
Pt c/o cough x 4 days with yellow sputum. Nad. soreness to chest only with coughing.  nad

## 2017-10-07 NOTE — ED Provider Notes (Signed)
[facility] EMERGENCY DEPARTMENT Provider Note   CSN: [id number] Arrival date & time: 10/07/17  1813     History   Chief Complaint Chief Complaint  Patient presents with  . Cough    HPI Dominic Henry is a 42 y.o. male.  Patient is a 42 year old male who presents to the emergency department with a complaint of cough and congestion. Patient and family state that the cough is been going on for about 4 days.  It has been productive with a yellowish looking phlegm.  No hemoptysis reported.  No chills or fever reported.  The patient states however that he is coughing to the point of at times having pain and one episode of coughing to the point of vomiting.  He is not had any syncopal episodes.  He has not had any neck, jaw, or shoulder pain related to his shortness of breath.  He is not had any unusual swelling of his hands abdomen or feet.  It is of note that the patient has a history of hypertension and chronic renal disease.  He has been treated in the past for community-acquired pneumonia, but he states that he is not had fever, chills, or related issue other than the cough.  He presents now for assistance with this issue.    Productive cough. No fever. No blood. Shortness of Breath.     Past Medical History:  Diagnosis Date  . CKD (chronic kidney disease), stage III ([place]) 12/16/2016  . Hypertension 05/21/2013  . Stroke [facility]) 05/21/2014    Patient Active Problem List   Diagnosis Date Noted  . CKD (chronic kidney disease), stage III ([place]) 12/16/2016  . Vitamin B12 deficiency 12/15/2016  . Normocytic anemia 12/14/2016  . CAP (community acquired pneumonia) 12/13/2016  . AKI (acute kidney injury) ([place]) 12/13/2016  . Essential hypertension 12/13/2016    History reviewed. No pertinent surgical history.      Home Medications    Prior to Admission medications   Medication Sig Start Date End Date Taking? Authorizing Provider  amLODipine (NORVASC) 5 MG tablet Take 1  tablet (5 mg total) by mouth daily. For hypertension 12/17/16   Rexene Alberts, MD  benzonatate (TESSALON) 100 MG capsule Take 1 capsule (100 mg total) by mouth 3 (three) times daily as needed for cough. 12/16/16   Rexene Alberts, MD  diphenhydrAMINE (BENADRYL) 25 MG tablet Take 25 mg by mouth every 6 (six) hours as needed for itching or allergies.    [provider]  metoprolol succinate (TOPROL-XL) 100 MG 24 hr tablet Take 1 tablet (100 mg total) by mouth daily. Take with or immediately following a meal. 12/16/16   Rexene Alberts, MD  vitamin B-12 (CYANOCOBALAMIN) 1000 MCG tablet Take 1 tablet (1,000 mcg total) by mouth daily. 12/16/16   Rexene Alberts, MD    Family History History reviewed. No pertinent family history.  Social History Social History   Tobacco Use  . Smoking status: Never Smoker  . Smokeless tobacco: Never Used  Substance Use Topics  . Alcohol use: No  . Drug use: No     Allergies   Patient has no known allergies.   Review of Systems Review of Systems  Constitutional: Negative for activity change.       All ROS Neg except as noted in HPI  HENT: Positive for congestion. Negative for nosebleeds.   Eyes: Negative for photophobia and discharge.  Respiratory: Positive for cough, chest tightness, shortness of breath and wheezing.   Cardiovascular: Negative for  chest pain and palpitations.  Gastrointestinal: Negative for abdominal pain and blood in stool.  Genitourinary: Negative for dysuria, frequency and hematuria.  Musculoskeletal: Negative for arthralgias, back pain and neck pain.  Skin: Negative.   Neurological: Negative for dizziness, seizures and speech difficulty.  Psychiatric/Behavioral: Negative for confusion and hallucinations.     Physical Exam Updated Vital Signs BP (!) 162/107 (BP Location: Right Arm)   Pulse 92   Temp 98.7 F (37.1 C) (Oral)   Resp 20   SpO2 98%   Physical Exam  Constitutional: He is oriented to person, place, and  time. He appears well-developed and well-nourished.  Non-toxic appearance.  HENT:  Head: Normocephalic.  Right Ear: Tympanic membrane and external ear normal.  Left Ear: Tympanic membrane and external ear normal.  Eyes: Pupils are equal, round, and reactive to light. EOM and lids are normal.  Neck: Normal range of motion. Neck supple. Carotid bruit is not present.  Cardiovascular: Normal rate, regular rhythm, normal heart sounds, intact distal pulses and normal pulses.  Pulmonary/Chest: No accessory muscle usage. Tachypnea noted. No respiratory distress. He has wheezes. He has rhonchi.  Coarse breath sounds present with few scattered wheezes following the first nebulizer treatment.  The patient has symmetrical rise and fall of the chest.  He is able to speak most of his sentences completely.  He does seem to have a tachypnea of 23 breaths/min.  Anterior chest wall tenderness  With deep breathing and with palpation.  No crepitus noted.  Abdominal: Soft. Bowel sounds are normal. He exhibits no distension. There is no tenderness. There is no guarding.  No abdominal distention.  Musculoskeletal: Normal range of motion. He exhibits no edema.  No edema of the upper or lower extremities.  Full range of motion of upper and lower extremities.  Lymphadenopathy:       Head (right side): No submandibular adenopathy present.       Head (left side): No submandibular adenopathy present.    He has no cervical adenopathy.  Neurological: He is alert and oriented to person, place, and time. He has normal strength. No cranial nerve deficit or sensory deficit.  Skin: Skin is warm and dry.  Psychiatric: He has a normal mood and affect. His speech is normal.  Nursing note and vitals reviewed.    ED Treatments / Results  Labs (all labs ordered are listed, but only abnormal results are displayed) Labs Reviewed - No data to display  EKG None  Radiology Dg Chest 2 View  Result Date: 10/07/2017 CLINICAL  DATA:  42 year old with a productive cough. Chest pain and shortness of breath. EXAM: CHEST - 2 VIEW COMPARISON:  12/13/2016 FINDINGS: No focal airspace disease or pulmonary edema. Slightly low lung volumes. Heart and mediastinum are within normal limits. No pleural effusions. Trachea is midline. No acute bone abnormality. IMPRESSION: No active cardiopulmonary disease. Electronically Signed   By: Markus Daft M.D.   On: 10/07/2017 19:21    Procedures Procedures (including critical care time)  Medications Ordered in ED Medications  ipratropium-albuterol (DUONEB) 0.5-2.5 (3) MG/3ML nebulizer solution 3 mL (3 mLs Nebulization Given 10/07/17 1835)     Initial Impression / Assessment and Plan / ED Course  I have reviewed the triage vital signs and the nursing notes.  Pertinent labs & imaging results that were available during my care of the patient were reviewed by me and considered in my medical decision making (see chart for details).       Final Clinical Impressions(s) /  ED Diagnoses MDM  Blood pressure is elevated, otherwise vital signs within normal limits.  Patient received a nebulizer treatment while waiting to come back to the room, he continues to have a few scattered wheezes and significant amount of coughing.  The patient is noted to have chest wall tenderness and some tenderness with deep breathing.  The chest x-ray is consistent with some bronchitis, no pneumonia or pneumothorax appreciated.  After a second nebulizer treatment, the breathing seems to be a lot easier, however the cough still seems to be an issue.  Patient was given cough medication.  Patient having some more nasal congestion.  Patient treated with Afrin spray.  The cough is slowing up.  The patient was ambulated in the hallway without any desaturation.  Recheck.  The patient is sitting up in the chair working with his phone and conversing with family.  He is in no distress.  Cough seems to be  improving.   Patient will use Afrin spray 2 times daily for congestion and cough.  Prescription for Vicodin given to the patient for cough.  Decadron 2 times daily with food.  Patient advised to see his primary physician this week concerning his blood pressure elevations.  We also discussed the type of over-the-counter medications to be used, especially since the patient has history of hypertension and previous cerebrovascular accident.  The patient will return to the emergency department immediately if any changes in condition, problems, or concerns.    Final diagnoses:  Bronchitis  Upper respiratory tract infection, unspecified type  Chest wall pain    ED Discharge Orders        Ordered    HYDROcodone-acetaminophen (NORCO/VICODIN) 5-325 MG tablet  Every 4 hours PRN     10/07/17 2124    dexamethasone (DECADRON) 4 MG tablet  2 times daily with meals     10/07/17 2203    doxycycline (VIBRAMYCIN) 100 MG capsule  2 times daily     10/07/17 2203    HYDROcodone-homatropine (HYCODAN) 5-1.5 MG/5ML syrup  Every 6 hours PRN     10/07/17 2203       Lily Kocher, PA-C 10/07/17 2215    Julianne Rice, MD 10/11/17 1428

## 2017-10-07 NOTE — ED Notes (Signed)
Pt states he has been taking Nyquil. Pt educated on HTN and OTC cold medications.

## 2017-10-08 MED FILL — Hydrocodone-Acetaminophen Tab 5-325 MG: ORAL | Qty: 6 | Status: AC

## 2017-10-10 NOTE — ED Notes (Signed)
Pt states they were told by provider if he needed a few extra days off work if he was still coughing.  Printed work not to be off until 10/14/2017

## 2017-10-16 ENCOUNTER — Emergency Department
Admission: EM | Admit: 2017-10-16 | Discharge: 2017-10-16 | Disposition: A | Discharge status: Home / Self Care | Payer: Self-pay | Attending: Emergency Medicine | Admitting: Emergency Medicine

## 2017-10-16 ENCOUNTER — Encounter: Payer: Self-pay | Admitting: Emergency Medicine

## 2017-10-16 ENCOUNTER — Other Ambulatory Visit: Payer: Self-pay

## 2017-10-16 ENCOUNTER — Emergency Department: Payer: Self-pay

## 2017-10-16 DIAGNOSIS — I129 Hypertensive chronic kidney disease with stage 1 through stage 4 chronic kidney disease, or unspecified chronic kidney disease: Secondary | ICD-10-CM | POA: Insufficient documentation

## 2017-10-16 DIAGNOSIS — J9801 Acute bronchospasm: Secondary | ICD-10-CM | POA: Insufficient documentation

## 2017-10-16 DIAGNOSIS — N183 Chronic kidney disease, stage 3 (moderate): Secondary | ICD-10-CM | POA: Insufficient documentation

## 2017-10-16 DIAGNOSIS — Z79899 Other long term (current) drug therapy: Secondary | ICD-10-CM | POA: Insufficient documentation

## 2017-10-16 MED ORDER — BENZONATATE 100 MG PO CAPS: 200.0000 mg | ORAL_CAPSULE | Freq: Three times a day (TID) | ORAL | 0 refills | Status: DC | PRN

## 2017-10-16 MED ORDER — HYDROCOD POLST-CPM POLST ER 10-8 MG/5ML PO SUER: 5.0000 mL | Freq: Two times a day (BID) | ORAL | 0 refills | Status: DC

## 2017-10-16 MED ORDER — METHYLPREDNISOLONE 4 MG PO TBPK: ORAL_TABLET | ORAL | 0 refills | Status: DC

## 2017-10-16 MED ORDER — HYDROCOD POLST-CPM POLST ER 10-8 MG/5ML PO SUER
5.0000 mL | Freq: Once | ORAL | Status: AC
  Administered 2017-10-16: 5 mL via ORAL
  Filled 2017-10-16: qty 5

## 2017-10-16 NOTE — ED Provider Notes (Signed)
[facility] Emergency Department Provider Note   ____________________________________________   First MD Initiated Contact with Patient 10/16/17 [phone]     (approximate)  I have reviewed the triage vital signs and the nursing notes.   HISTORY  Chief Complaint Cough    HPI Dominic Henry is a 42 y.o. male patient complain of persistent cough for 3 weeks.  Patient was diagnosed with bronchitis on 10/07/2017.  Patient has completed a course of doxycycline and steroids with no improvement.  Patient states cough is worse with laying down secondary to postnasal drainage.  Patient state coughing spells cause dizziness.  Patient denies fever/chills.  Patient denies chest pain.   Past Medical History:  Diagnosis Date  . CKD (chronic kidney disease), stage III ([place]) 12/16/2016  . Hypertension 05/21/2013  . Stroke [facility]) 05/21/2014    Patient Active Problem List   Diagnosis Date Noted  . CKD (chronic kidney disease), stage III ([place]) 12/16/2016  . Vitamin B12 deficiency 12/15/2016  . Normocytic anemia 12/14/2016  . CAP (community acquired pneumonia) 12/13/2016  . AKI (acute kidney injury) ([place]) 12/13/2016  . Essential hypertension 12/13/2016    History reviewed. No pertinent surgical history.  Prior to Admission medications   Medication Sig Start Date End Date Taking? Authorizing Provider  amLODipine (NORVASC) 5 MG tablet Take 1 tablet (5 mg total) by mouth daily. For hypertension 12/17/16   Rexene Alberts, MD  benzonatate (TESSALON PERLES) 100 MG capsule Take 2 capsules (200 mg total) by mouth 3 (three) times daily as needed. 10/16/17 10/16/18  Sable Feil, PA-C  benzonatate (TESSALON) 100 MG capsule Take 1 capsule (100 mg total) by mouth 3 (three) times daily as needed for cough. 12/16/16   Rexene Alberts, MD  chlorpheniramine-HYDROcodone [facility] PENNKINETIC ER) 10-8 MG/5ML SUER Take 5 mLs by mouth 2 (two) times daily. 10/16/17   Sable Feil, PA-C    dexamethasone (DECADRON) 4 MG tablet Take 1 tablet (4 mg total) by mouth 2 (two) times daily with a meal. 10/07/17   Lily Kocher, PA-C  diphenhydrAMINE (BENADRYL) 25 MG tablet Take 25 mg by mouth every 6 (six) hours as needed for itching or allergies.    [provider]  doxycycline (VIBRAMYCIN) 100 MG capsule Take 1 capsule (100 mg total) by mouth 2 (two) times daily. 10/07/17   Lily Kocher, PA-C  HYDROcodone-acetaminophen (NORCO/VICODIN) 5-325 MG tablet Take 1-2 tablets by mouth every 4 (four) hours as needed. 10/07/17   Lily Kocher, PA-C  HYDROcodone-homatropine [facility]) 5-1.5 MG/5ML syrup Take 5 mLs by mouth every 6 (six) hours as needed. 10/07/17   Lily Kocher, PA-C  methylPREDNISolone (MEDROL DOSEPAK) 4 MG TBPK tablet Take Tapered dose as directed 10/16/17   Sable Feil, PA-C  metoprolol succinate (TOPROL-XL) 100 MG 24 hr tablet Take 1 tablet (100 mg total) by mouth daily. Take with or immediately following a meal. 12/16/16   Rexene Alberts, MD  vitamin B-12 (CYANOCOBALAMIN) 1000 MCG tablet Take 1 tablet (1,000 mcg total) by mouth daily. 12/16/16   Rexene Alberts, MD    Allergies Patient has no known allergies.  No family history on file.  Social History Social History   Tobacco Use  . Smoking status: Never Smoker  . Smokeless tobacco: Never Used  Substance Use Topics  . Alcohol use: No  . Drug use: No    Review of Systems  Constitutional: No fever/chills Eyes: No visual changes. ENT: No sore throat. Cardiovascular: Denies chest pain. Respiratory: Denies shortness of breath.  Productive cough. Gastrointestinal: No abdominal pain.  No nausea, no vomiting.  No diarrhea.  No constipation. Genitourinary: Negative for dysuria. Musculoskeletal: Negative for back pain. Skin: Negative for rash. Neurological: Negative for headaches, focal weakness or numbness. Endocrine:Hypertension and chronic kidney  disease.  ____________________________________________   PHYSICAL EXAM:  VITAL SIGNS: ED Triage Vitals [10/16/17 0741]  Enc Vitals Group     BP (!) 164/98     Pulse Rate 72     Resp 18     Temp 98.1 F (36.7 C)     Temp Source Oral     SpO2 98 %     Weight 255 lb (115.7 kg)     Height 5\' 10"  (1.778 m)     Head Circumference      Peak Flow      Pain Score 0     Pain Loc      Pain Edu?      Excl. in [place]?    Constitutional: Alert and oriented. Well appearing and in no acute distress. Eyes: Conjunctivae are normal. PERRL. EOMI. Head: Atraumatic. Nose: Edematous nasal turbinates clear rhinorrhea.   Mouth/Throat: Mucous membranes are moist.  Oropharynx non-erythematous.  Postnasal drainage. Neck: No stridor. Hematological/Lymphatic/Immunilogical: No cervical lymphadenopathy. Cardiovascular: Normal rate, regular rhythm. Grossly normal heart sounds.  Good peripheral circulation.  Elevated blood pressure. Respiratory: Normal respiratory effort.  No retractions. Lungs CTAB. Neurologic:  Normal speech and language. No gross focal neurologic deficits are appreciated. No gait instability. Skin:  Skin is warm, dry and intact. No rash noted. Psychiatric: Mood and affect are normal. Speech and behavior are normal.  ____________________________________________   LABS (all labs ordered are listed, but only abnormal results are displayed)  Labs Reviewed - No data to display ____________________________________________  EKG   ____________________________________________  RADIOLOGY    Official radiology report(s): Dg Chest 2 View  Result Date: 10/16/2017 CLINICAL DATA:  Persistent cough for 10 days EXAM: CHEST - 2 VIEW COMPARISON:  10/07/2017 FINDINGS: There is no edema, consolidation, effusion, or pneumothorax. Normal heart size and mediastinal contours. IMPRESSION: No evidence of active disease. Electronically Signed   By: Monte Fantasia M.D.   On: 10/16/2017 08:51     ____________________________________________   PROCEDURES  Procedure(s) performed: None  Procedures  Critical Care performed: No  ____________________________________________   INITIAL IMPRESSION / ASSESSMENT AND PLAN / ED COURSE  As part of my medical decision making, I reviewed the following data within the electronic MEDICAL RECORD NUMBER    Cough secondary bronchospasms.  Discussed negative x-ray findings of the chest with patient.  Patient given discharge care instruction work note.  Patient advised take medication as directed.  Patient advised to follow-up with PCP if condition persists.      ____________________________________________   FINAL CLINICAL IMPRESSION(S) / ED DIAGNOSES  Final diagnoses:  Cough due to bronchospasm     ED Discharge Orders        Ordered    chlorpheniramine-HYDROcodone (TUSSIONEX PENNKINETIC ER) 10-8 MG/5ML SUER  2 times daily     10/16/17 0854    benzonatate (TESSALON PERLES) 100 MG capsule  3 times daily PRN     10/16/17 0854    methylPREDNISolone (MEDROL DOSEPAK) 4 MG TBPK tablet     10/16/17 0854       Note:  This document was prepared using Dragon voice recognition software and may include unintentional dictation errors.    Sable Feil, PA-C 10/16/17 2947    Carrie Mew, MD 10/16/17 1524

## 2017-10-16 NOTE — ED Triage Notes (Signed)
Pt c/o cough x 3 weeks, states it is thick white sputum, dx with bronchitis on 5/20 and URI.  Pt states he continues to cough and coughs so hard at times it makes his head "fuzzy"  Pt is alert and oriented, ambulatory in lobby without difficulty.  C/o nasal drainage esp when laying down

## 2017-10-16 NOTE — ED Notes (Signed)
See triage note   States he was seen last week and dx'd with bronchitis and URI  States he took all his meds as directed  conts to have cough  Feels like something gets stuck when he cough   Afebrile on arrival

## 2017-10-28 ENCOUNTER — Emergency Department: Payer: Self-pay

## 2017-10-28 ENCOUNTER — Encounter: Payer: Self-pay | Admitting: Emergency Medicine

## 2017-10-28 ENCOUNTER — Emergency Department
Admission: EM | Admit: 2017-10-28 | Discharge: 2017-10-28 | Disposition: A | Discharge status: Home / Self Care | Payer: Self-pay | Attending: Emergency Medicine | Admitting: Emergency Medicine

## 2017-10-28 ENCOUNTER — Other Ambulatory Visit: Payer: Self-pay

## 2017-10-28 DIAGNOSIS — N184 Chronic kidney disease, stage 4 (severe): Secondary | ICD-10-CM

## 2017-10-28 DIAGNOSIS — I129 Hypertensive chronic kidney disease with stage 1 through stage 4 chronic kidney disease, or unspecified chronic kidney disease: Secondary | ICD-10-CM | POA: Insufficient documentation

## 2017-10-28 DIAGNOSIS — N183 Chronic kidney disease, stage 3 (moderate): Secondary | ICD-10-CM | POA: Insufficient documentation

## 2017-10-28 DIAGNOSIS — Z79899 Other long term (current) drug therapy: Secondary | ICD-10-CM | POA: Insufficient documentation

## 2017-10-28 DIAGNOSIS — J209 Acute bronchitis, unspecified: Secondary | ICD-10-CM | POA: Insufficient documentation

## 2017-10-28 LAB — COMPREHENSIVE METABOLIC PANEL
ALBUMIN: 3.9 g/dL (ref 3.5–5.0)
ALT: 35 U/L (ref 17–63)
AST: 33 U/L (ref 15–41)
Alkaline Phosphatase: 83 U/L (ref 38–126)
Anion gap: 9 (ref 5–15)
BILIRUBIN TOTAL: 0.7 mg/dL (ref 0.3–1.2)
BUN: 49 mg/dL — AB (ref 6–20)
CO2: 19 mmol/L — ABNORMAL LOW (ref 22–32)
CREATININE: 4.31 mg/dL — AB (ref 0.61–1.24)
Calcium: 8.9 mg/dL (ref 8.9–10.3)
Chloride: 108 mmol/L (ref 101–111)
GFR calc Af Amer: 18 mL/min — ABNORMAL LOW (ref >60)
GFR calc non Af Amer: 15 mL/min — ABNORMAL LOW (ref >60)
GLUCOSE: 106 mg/dL — AB (ref 65–99)
Potassium: 4.8 mmol/L (ref 3.5–5.1)
Sodium: 136 mmol/L (ref 135–145)
TOTAL PROTEIN: 8 g/dL (ref 6.5–8.1)

## 2017-10-28 LAB — CBC WITH DIFFERENTIAL/PLATELET
BASOS ABS: 0.1 10*3/uL (ref 0–0.1)
Basophils Relative: 1 %
Eosinophils Absolute: 0.3 10*3/uL (ref 0–0.7)
Eosinophils Relative: 5 %
HEMATOCRIT: 37.5 % — AB (ref 40.0–52.0)
HEMOGLOBIN: 12.3 g/dL — AB (ref 13.0–18.0)
LYMPHS PCT: 31 %
Lymphs Abs: 1.7 10*3/uL (ref 1.0–3.6)
MCH: 26.6 pg (ref 26.0–34.0)
MCHC: 32.8 g/dL (ref 32.0–36.0)
MCV: 80.9 fL (ref 80.0–100.0)
Monocytes Absolute: 0.5 10*3/uL (ref 0.2–1.0)
Monocytes Relative: 10 %
NEUTROS ABS: 2.8 10*3/uL (ref 1.4–6.5)
NEUTROS PCT: 53 %
Platelets: 249 10*3/uL (ref 150–440)
RBC: 4.64 MIL/uL (ref 4.40–5.90)
RDW: 13.8 % (ref 11.5–14.5)
WBC: 5.3 10*3/uL (ref 3.8–10.6)

## 2017-10-28 MED ORDER — IPRATROPIUM-ALBUTEROL 0.5-2.5 (3) MG/3ML IN SOLN
3.0000 mL | Freq: Once | RESPIRATORY_TRACT | Status: AC
  Administered 2017-10-28: 3 mL via RESPIRATORY_TRACT
  Filled 2017-10-28: qty 3

## 2017-10-28 MED ORDER — IPRATROPIUM-ALBUTEROL 0.5-2.5 (3) MG/3ML IN SOLN: 3.0000 mL | RESPIRATORY_TRACT | 2 refills | Status: DC | PRN

## 2017-10-28 MED ORDER — OMEPRAZOLE 40 MG PO CPDR: 40.0000 mg | DELAYED_RELEASE_CAPSULE | Freq: Every day | ORAL | 0 refills | Status: DC

## 2017-10-28 MED ORDER — METHYLPREDNISOLONE SODIUM SUCC 125 MG IJ SOLR
125.0000 mg | Freq: Once | INTRAMUSCULAR | Status: AC
  Administered 2017-10-28: 125 mg via INTRAVENOUS
  Filled 2017-10-28 (×2): qty 2

## 2017-10-28 MED ORDER — METHYLPREDNISOLONE 4 MG PO TBPK: ORAL_TABLET | ORAL | 0 refills | Status: DC

## 2017-10-28 NOTE — ED Triage Notes (Signed)
Cough x 1 month, productive yellow.

## 2017-10-28 NOTE — Discharge Instructions (Addendum)
Follow-up with your regular doctor as needed for the cough.  Call Dr. Keturah Barre office for a follow-up of your kidney failure.

## 2017-10-28 NOTE — ED Notes (Addendum)
See triage note  Was seen about 1 1/2 weeks ago for cough  States cough is prod  But is thicker at present  Denies any fever   But is having increased pain across bilateral rib area

## 2017-10-28 NOTE — ED Provider Notes (Signed)
[facility] Emergency Department Provider Note  ____________________________________________   First MD Initiated Contact with Patient 10/28/17 1215     (approximate)  I have reviewed the triage vital signs and the nursing notes.   HISTORY  Chief Complaint Cough    HPI Dominic Henry is a 42 y.o. male complains of cough for 1 to 2 weeks.  He states he was seen at an urgent care and given doxycycline, prednisone and a cough medicine.  He then ended up coming here as the cough got worse.  He states he got a little bit better but the cough is continued.  He states the mucus is been slightly yellow.  Past Medical History:  Diagnosis Date  . CKD (chronic kidney disease), stage III ([place]) 12/16/2016  . Hypertension 05/21/2013  . Stroke [facility]) 05/21/2014    Patient Active Problem List   Diagnosis Date Noted  . CKD (chronic kidney disease), stage III ([place]) 12/16/2016  . Vitamin B12 deficiency 12/15/2016  . Normocytic anemia 12/14/2016  . CAP (community acquired pneumonia) 12/13/2016  . AKI (acute kidney injury) ([place]) 12/13/2016  . Essential hypertension 12/13/2016    History reviewed. No pertinent surgical history.  Prior to Admission medications   Medication Sig Start Date End Date Taking? Authorizing Provider  amLODipine (NORVASC) 5 MG tablet Take 1 tablet (5 mg total) by mouth daily. For hypertension 12/17/16   Rexene Alberts, MD  benzonatate (TESSALON PERLES) 100 MG capsule Take 2 capsules (200 mg total) by mouth 3 (three) times daily as needed. 10/16/17 10/16/18  Sable Feil, PA-C  benzonatate (TESSALON) 100 MG capsule Take 1 capsule (100 mg total) by mouth 3 (three) times daily as needed for cough. 12/16/16   Rexene Alberts, MD  chlorpheniramine-HYDROcodone [facility] PENNKINETIC ER) 10-8 MG/5ML SUER Take 5 mLs by mouth 2 (two) times daily. 10/16/17   Sable Feil, PA-C  dexamethasone (DECADRON) 4 MG tablet Take 1 tablet (4 mg total) by mouth 2  (two) times daily with a meal. 10/07/17   Lily Kocher, PA-C  diphenhydrAMINE (BENADRYL) 25 MG tablet Take 25 mg by mouth every 6 (six) hours as needed for itching or allergies.    [provider]  doxycycline (VIBRAMYCIN) 100 MG capsule Take 1 capsule (100 mg total) by mouth 2 (two) times daily. 10/07/17   Lily Kocher, PA-C  HYDROcodone-acetaminophen (NORCO/VICODIN) 5-325 MG tablet Take 1-2 tablets by mouth every 4 (four) hours as needed. 10/07/17   Lily Kocher, PA-C  HYDROcodone-homatropine [facility]) 5-1.5 MG/5ML syrup Take 5 mLs by mouth every 6 (six) hours as needed. 10/07/17   Lily Kocher, PA-C  ipratropium-albuterol (DUONEB) 0.5-2.5 (3) MG/3ML SOLN Take 3 mLs by nebulization every 4 (four) hours as needed. 10/28/17   Fisher, [place] Dolin, PA-C  methylPREDNISolone (MEDROL DOSEPAK) 4 MG TBPK tablet Take 6 pills on day one then decrease by 1 pill each day 10/28/17   Versie Starks, PA-C  metoprolol succinate (TOPROL-XL) 100 MG 24 hr tablet Take 1 tablet (100 mg total) by mouth daily. Take with or immediately following a meal. 12/16/16   Rexene Alberts, MD  omeprazole (PRILOSEC) 40 MG capsule Take 1 capsule (40 mg total) by mouth daily. 10/28/17   Fisher, Linden Dolin, PA-C  vitamin B-12 (CYANOCOBALAMIN) 1000 MCG tablet Take 1 tablet (1,000 mcg total) by mouth daily. 12/16/16   Rexene Alberts, MD    Allergies Patient has no known allergies.  No family history on file.  Social History Social History  Tobacco Use  . Smoking status: Never Smoker  . Smokeless tobacco: Never Used  Substance Use Topics  . Alcohol use: No  . Drug use: No    Review of Systems  Constitutional: No fever/chills Eyes: No visual changes. ENT: No sore throat. Respiratory: Positive cough Genitourinary: Negative for dysuria. Musculoskeletal: Negative for back pain. Skin: Negative for rash.    ____________________________________________   PHYSICAL EXAM:  VITAL SIGNS: ED Triage Vitals  Enc Vitals  Group     BP 10/28/17 1140 (!) 159/110     Pulse Rate 10/28/17 1140 100     Resp 10/28/17 1140 18     Temp 10/28/17 1140 98.4 F (36.9 C)     Temp Source 10/28/17 1140 Oral     SpO2 10/28/17 1140 98 %     Weight 10/28/17 1143 250 lb (113.4 kg)     Height 10/28/17 1143 5\' 10"  (1.778 m)     Head Circumference --      Peak Flow --      Pain Score 10/28/17 1142 0     Pain Loc --      Pain Edu? --      Excl. in [place]? --     Constitutional: Alert and oriented. Well appearing and in no acute distress. Eyes: Conjunctivae are normal.  Head: Atraumatic. Nose: No congestion/rhinnorhea. Mouth/Throat: Mucous membranes are moist.   Cardiovascular: Normal rate, regular rhythm.  Heart sounds are normal Respiratory: Normal respiratory effort.  No retractions, The cough is hacking.  The patient actually vomits after coughing. GU: deferred Musculoskeletal: FROM all extremities, warm and well perfused Neurologic:  Normal speech and language.  Skin:  Skin is warm, dry and intact. No rash noted. Psychiatric: Mood and affect are normal. Speech and behavior are normal.  ____________________________________________   LABS (all labs ordered are listed, but only abnormal results are displayed)  Labs Reviewed  CBC WITH DIFFERENTIAL/PLATELET - Abnormal; Notable for the following components:      Result Value   Hemoglobin 12.3 (*)    HCT 37.5 (*)    All other components within normal limits  COMPREHENSIVE METABOLIC PANEL - Abnormal; Notable for the following components:   CO2 19 (*)    Glucose, Bld 106 (*)    BUN 49 (*)    Creatinine, Ser 4.31 (*)    GFR calc non Af Amer 15 (*)    GFR calc Af Amer 18 (*)    All other components within normal limits   ____________________________________________   ____________________________________________  RADIOLOGY  Chest x-ray is negative for any acute abnormality  ____________________________________________   PROCEDURES  Procedure(s) performed:  Saline lock, Solu-Medrol 125 mg IV, DuoNeb  Procedures    ____________________________________________   INITIAL IMPRESSION / ASSESSMENT AND PLAN / ED COURSE  Pertinent labs & imaging results that were available during my care of the patient were reviewed by me and considered in my medical decision making (see chart for details).  Patient is 42 year old male presents emergency department complaining of a cough for 1 to 2 weeks.  He denies fever or chills.  On physical exam the patient has a hacking cough.  At times he tends to vomit afterwards.  DuoNeb was given.  Patient has increased air movement and feels much better after the nebulizer treatment.  Chest x-ray is negative Labs show a normal CBC, metabolic panel shows increased creatinine at 4.31.  He was at 2.251-year ago.  Discussed the kidney functions with Dr. Oris Drone.  She recommended  to call nephrology. Paged nephrology Dr. Candiss Norse Dr. Candiss Norse took the patient's information.  He is to call his clinic if he is not heard from them to make an appointment.  I explained all of this to the patient and his family members.  He is to call Dr. Candiss Norse if he is not heard from him in 1 to 2 days.  They state they understand will comply with our instructions.  He was discharged with a prescription for a nebulizer machine, DuoNeb Nebules, steroid pack, and omeprazole.  He was given a work note for today and tomorrow.  Was discharged in stable condition     As part of my medical decision making, I reviewed the following data within the [place] notes reviewed and incorporated, Labs reviewed CBC is normal, metabolic panel shows a creatinine level of 4.31, Old chart reviewed, Radiograph reviewed chest x-ray is normal, A consult was requested and obtained from this/these consultant(s) nephrology, Dr. Candiss Norse, Notes from prior ED visits and [place] Controlled Substance  Database  ____________________________________________   FINAL CLINICAL IMPRESSION(S) / ED DIAGNOSES  Final diagnoses:  Acute bronchitis, unspecified organism  Chronic renal failure, stage 4 (severe) ([place])      NEW MEDICATIONS STARTED DURING THIS VISIT:  Discharge Medication List as of 10/28/2017  2:59 PM    START taking these medications   Details  ipratropium-albuterol (DUONEB) 0.5-2.5 (3) MG/3ML SOLN Take 3 mLs by nebulization every 4 (four) hours as needed., Starting Mon 10/28/2017, Print    omeprazole (PRILOSEC) 40 MG capsule Take 1 capsule (40 mg total) by mouth daily., Starting Mon 10/28/2017, Print         Note:  This document was prepared using Dragon voice recognition software and may include unintentional dictation errors.    Versie Starks, PA-C 10/28/17 [place], [place], MD 10/29/17 [phone]

## 2017-11-29 ENCOUNTER — Other Ambulatory Visit
Admission: RE | Admit: 2017-11-29 | Discharge: 2017-11-29 | Disposition: A | Discharge status: Home / Self Care | Payer: Medicaid Other | Source: Ambulatory Visit | Attending: Nephrology | Admitting: Nephrology

## 2017-11-29 DIAGNOSIS — I129 Hypertensive chronic kidney disease with stage 1 through stage 4 chronic kidney disease, or unspecified chronic kidney disease: Secondary | ICD-10-CM | POA: Insufficient documentation

## 2017-11-29 DIAGNOSIS — N184 Chronic kidney disease, stage 4 (severe): Secondary | ICD-10-CM | POA: Diagnosis present

## 2017-11-29 DIAGNOSIS — R809 Proteinuria, unspecified: Secondary | ICD-10-CM | POA: Diagnosis present

## 2017-11-29 LAB — CBC WITH DIFFERENTIAL/PLATELET
BASOS ABS: 0.1 10*3/uL (ref 0–0.1)
Basophils Relative: 1 %
EOS PCT: 5 %
Eosinophils Absolute: 0.3 10*3/uL (ref 0–0.7)
HCT: 35.5 % — ABNORMAL LOW (ref 40.0–52.0)
HEMOGLOBIN: 11.7 g/dL — AB (ref 13.0–18.0)
LYMPHS ABS: 1.7 10*3/uL (ref 1.0–3.6)
Lymphocytes Relative: 32 %
MCH: 26.6 pg (ref 26.0–34.0)
MCHC: 32.9 g/dL (ref 32.0–36.0)
MCV: 80.9 fL (ref 80.0–100.0)
Monocytes Absolute: 0.5 10*3/uL (ref 0.2–1.0)
Monocytes Relative: 10 %
NEUTROS ABS: 2.8 10*3/uL (ref 1.4–6.5)
NEUTROS PCT: 52 %
PLATELETS: 379 10*3/uL (ref 150–440)
RBC: 4.39 MIL/uL — AB (ref 4.40–5.90)
RDW: 13.5 % (ref 11.5–14.5)
WBC: 5.3 10*3/uL (ref 3.8–10.6)

## 2017-11-29 LAB — COMPREHENSIVE METABOLIC PANEL
ALT: 35 U/L (ref 0–44)
ANION GAP: 9 (ref 5–15)
AST: 23 U/L (ref 15–41)
Albumin: 3.7 g/dL (ref 3.5–5.0)
Alkaline Phosphatase: 87 U/L (ref 38–126)
BILIRUBIN TOTAL: 0.5 mg/dL (ref 0.3–1.2)
BUN: 34 mg/dL — ABNORMAL HIGH (ref 6–20)
CALCIUM: 8.9 mg/dL (ref 8.9–10.3)
CO2: 22 mmol/L (ref 22–32)
CREATININE: 3.16 mg/dL — AB (ref 0.61–1.24)
Chloride: 108 mmol/L (ref 98–111)
GFR, EST AFRICAN AMERICAN: 26 mL/min — AB (ref >60)
GFR, EST NON AFRICAN AMERICAN: 22 mL/min — AB (ref >60)
Glucose, Bld: 109 mg/dL — ABNORMAL HIGH (ref 70–99)
Potassium: 4.4 mmol/L (ref 3.5–5.1)
SODIUM: 139 mmol/L (ref 135–145)
TOTAL PROTEIN: 7.5 g/dL (ref 6.5–8.1)

## 2017-11-29 LAB — LIPID PANEL
CHOLESTEROL: 305 mg/dL — AB (ref 0–200)
HDL: 35 mg/dL — AB (ref >40)
LDL Cholesterol: UNDETERMINED mg/dL (ref 0–99)
TRIGLYCERIDES: 408 mg/dL — AB (ref <150)
Total CHOL/HDL Ratio: 8.7 RATIO
VLDL: UNDETERMINED mg/dL (ref 0–40)

## 2017-11-29 LAB — CK: Total CK: 269 U/L (ref 49–397)

## 2017-11-29 LAB — TSH: TSH: 2.309 u[IU]/mL (ref 0.350–4.500)

## 2017-11-29 LAB — T4, FREE: Free T4: 0.78 ng/dL — ABNORMAL LOW (ref 0.82–1.77)

## 2017-11-29 LAB — URIC ACID: URIC ACID, SERUM: 9.1 mg/dL — AB (ref 3.7–8.6)

## 2017-11-30 LAB — C4 COMPLEMENT: COMPLEMENT C4, BODY FLUID: 74 mg/dL — AB (ref 14–44)

## 2017-11-30 LAB — PTH, INTACT AND CALCIUM
CALCIUM TOTAL (PTH): 9.3 mg/dL (ref 8.7–10.2)
PTH: 106 pg/mL — AB (ref 15–65)

## 2017-11-30 LAB — ANA W/REFLEX: Anti Nuclear Antibody(ANA): NEGATIVE

## 2017-11-30 LAB — HEPATITIS B SURFACE ANTIBODY, QUANTITATIVE: Hepatitis B-Post: 13.5 m[IU]/mL (ref >9.9)

## 2017-11-30 LAB — RPR: RPR Ser Ql: NONREACTIVE

## 2017-11-30 LAB — VITAMIN D 25 HYDROXY (VIT D DEFICIENCY, FRACTURES): Vit D, 25-Hydroxy: 8.5 ng/mL — ABNORMAL LOW (ref 30.0–100.0)

## 2017-11-30 LAB — HEPATITIS B CORE ANTIBODY, TOTAL: Hep B Core Total Ab: NEGATIVE

## 2017-11-30 LAB — C3 COMPLEMENT: C3 COMPLEMENT: 173 mg/dL — AB (ref 82–167)

## 2017-11-30 LAB — HCV AB W REFLEX TO QUANT PCR: HCV Ab: <0.1 s/co ratio (ref 0.0–0.9)

## 2017-11-30 LAB — HEPATITIS B SURFACE ANTIGEN: Hepatitis B Surface Ag: NEGATIVE

## 2017-12-01 LAB — HIV-1 RNA, PCR (GRAPH) RFX/GENO EDI: HIV-1 RNA Quant, Log: UNDETERMINED log10copy/mL

## 2017-12-02 LAB — PROTEIN ELECTROPHORESIS, SERUM
A/G Ratio: 0.8 (ref 0.7–1.7)
Albumin ELP: 3 g/dL (ref 2.9–4.4)
Alpha-1-Globulin: 0.2 g/dL (ref 0.0–0.4)
Alpha-2-Globulin: 0.9 g/dL (ref 0.4–1.0)
Beta Globulin: 1.3 g/dL (ref 0.7–1.3)
GAMMA GLOBULIN: 1.2 g/dL (ref 0.4–1.8)
GLOBULIN, TOTAL: 3.7 g/dL (ref 2.2–3.9)
TOTAL PROTEIN ELP: 6.7 g/dL (ref 6.0–8.5)

## 2017-12-02 LAB — ANCA TITERS
Atypical P-ANCA titer: <1:20 {titer}
P-ANCA: <1:20 {titer}

## 2017-12-02 LAB — GLOMERULAR BASEMENT MEMBRANE ANTIBODIES: GBM Ab: 3 units (ref 0–20)

## 2017-12-04 ENCOUNTER — Other Ambulatory Visit: Payer: Self-pay | Admitting: Nephrology

## 2017-12-04 DIAGNOSIS — N184 Chronic kidney disease, stage 4 (severe): Secondary | ICD-10-CM

## 2017-12-11 ENCOUNTER — Ambulatory Visit
Admission: RE | Admit: 2017-12-11 | Discharge: 2017-12-11 | Disposition: A | Discharge status: Home / Self Care | Payer: Medicaid Other | Source: Ambulatory Visit | Attending: Nephrology | Admitting: Nephrology

## 2017-12-11 DIAGNOSIS — N184 Chronic kidney disease, stage 4 (severe): Secondary | ICD-10-CM

## 2017-12-17 ENCOUNTER — Other Ambulatory Visit
Admission: RE | Admit: 2017-12-17 | Discharge: 2017-12-17 | Disposition: A | Discharge status: Home / Self Care | Payer: Medicaid Other | Source: Ambulatory Visit | Attending: Nephrology | Admitting: Nephrology

## 2017-12-17 DIAGNOSIS — I10 Essential (primary) hypertension: Secondary | ICD-10-CM | POA: Insufficient documentation

## 2017-12-17 DIAGNOSIS — N184 Chronic kidney disease, stage 4 (severe): Secondary | ICD-10-CM | POA: Diagnosis present

## 2017-12-17 DIAGNOSIS — R809 Proteinuria, unspecified: Secondary | ICD-10-CM | POA: Insufficient documentation

## 2017-12-17 LAB — PROTEIN / CREATININE RATIO, URINE
CREATININE, URINE: 141 mg/dL
PROTEIN CREATININE RATIO: 1.99 mg/mg{creat} — AB (ref 0.00–0.15)
TOTAL PROTEIN, URINE: 280 mg/dL

## 2017-12-17 LAB — URINALYSIS, COMPLETE (UACMP) WITH MICROSCOPIC
Bilirubin Urine: NEGATIVE
Glucose, UA: NEGATIVE mg/dL
KETONES UR: NEGATIVE mg/dL
Leukocytes, UA: NEGATIVE
Nitrite: NEGATIVE
PROTEIN: 100 mg/dL — AB
SQUAMOUS EPITHELIAL / LPF: NONE SEEN (ref 0–5)
Specific Gravity, Urine: 1.012 (ref 1.005–1.030)
pH: 5 (ref 5.0–8.0)

## 2017-12-24 ENCOUNTER — Other Ambulatory Visit: Admission: RE | Admit: 2017-12-24 | Payer: Medicaid Other | Source: Ambulatory Visit | Admitting: Nephrology

## 2017-12-30 ENCOUNTER — Inpatient Hospital Stay: Admission: RE | Admit: 2017-12-30 | Payer: Medicaid Other | Source: Ambulatory Visit

## 2017-12-30 ENCOUNTER — Other Ambulatory Visit
Admission: RE | Admit: 2017-12-30 | Discharge: 2017-12-30 | Disposition: A | Discharge status: Home / Self Care | Payer: Medicaid Other | Source: Ambulatory Visit | Attending: Nephrology | Admitting: Nephrology

## 2017-12-30 ENCOUNTER — Other Ambulatory Visit: Payer: Self-pay | Admitting: Nephrology

## 2017-12-30 DIAGNOSIS — R809 Proteinuria, unspecified: Secondary | ICD-10-CM | POA: Diagnosis present

## 2017-12-30 DIAGNOSIS — N184 Chronic kidney disease, stage 4 (severe): Secondary | ICD-10-CM | POA: Insufficient documentation

## 2017-12-30 DIAGNOSIS — I10 Essential (primary) hypertension: Secondary | ICD-10-CM | POA: Insufficient documentation

## 2017-12-30 LAB — CBC
HCT: 36 % — ABNORMAL LOW (ref 40.0–52.0)
Hemoglobin: 11.9 g/dL — ABNORMAL LOW (ref 13.0–18.0)
MCH: 26.4 pg (ref 26.0–34.0)
MCHC: 33.2 g/dL (ref 32.0–36.0)
MCV: 79.4 fL — ABNORMAL LOW (ref 80.0–100.0)
PLATELETS: 325 10*3/uL (ref 150–440)
RBC: 4.53 MIL/uL (ref 4.40–5.90)
RDW: 13.7 % (ref 11.5–14.5)
WBC: 5.3 10*3/uL (ref 3.8–10.6)

## 2017-12-30 LAB — COMPREHENSIVE METABOLIC PANEL
ALBUMIN: 3.8 g/dL (ref 3.5–5.0)
ALK PHOS: 85 U/L (ref 38–126)
ALT: 46 U/L — ABNORMAL HIGH (ref 0–44)
ANION GAP: 6 (ref 5–15)
AST: 27 U/L (ref 15–41)
BUN: 43 mg/dL — ABNORMAL HIGH (ref 6–20)
CALCIUM: 8.8 mg/dL — AB (ref 8.9–10.3)
CO2: 20 mmol/L — ABNORMAL LOW (ref 22–32)
Chloride: 110 mmol/L (ref 98–111)
Creatinine, Ser: 3.65 mg/dL — ABNORMAL HIGH (ref 0.61–1.24)
GFR, EST AFRICAN AMERICAN: 22 mL/min — AB (ref >60)
GFR, EST NON AFRICAN AMERICAN: 19 mL/min — AB (ref >60)
Glucose, Bld: 109 mg/dL — ABNORMAL HIGH (ref 70–99)
POTASSIUM: 4.4 mmol/L (ref 3.5–5.1)
Sodium: 136 mmol/L (ref 135–145)
TOTAL PROTEIN: 7.7 g/dL (ref 6.5–8.1)
Total Bilirubin: 0.6 mg/dL (ref 0.3–1.2)

## 2017-12-30 LAB — URINALYSIS, ROUTINE W REFLEX MICROSCOPIC
BACTERIA UA: NONE SEEN
BILIRUBIN URINE: NEGATIVE
Glucose, UA: NEGATIVE mg/dL
HGB URINE DIPSTICK: NEGATIVE
KETONES UR: NEGATIVE mg/dL
LEUKOCYTES UA: NEGATIVE
NITRITE: NEGATIVE
Protein, ur: 100 mg/dL — AB
SQUAMOUS EPITHELIAL / LPF: NONE SEEN (ref 0–5)
Specific Gravity, Urine: 1.012 (ref 1.005–1.030)
pH: 5 (ref 5.0–8.0)

## 2017-12-30 LAB — TYPE AND SCREEN
ABO/RH(D): O POS
Antibody Screen: NEGATIVE

## 2017-12-30 LAB — PROTEIN / CREATININE RATIO, URINE
Creatinine, Urine: 148 mg/dL
Protein Creatinine Ratio: 1.29 mg/mg{Cre} — ABNORMAL HIGH (ref 0.00–0.15)
Total Protein, Urine: 191 mg/dL

## 2017-12-30 LAB — PROTIME-INR
INR: 0.89
PROTHROMBIN TIME: 12 s (ref 11.4–15.2)

## 2017-12-31 ENCOUNTER — Other Ambulatory Visit: Payer: Self-pay | Admitting: Nephrology

## 2017-12-31 ENCOUNTER — Observation Stay: Payer: Medicaid Other

## 2017-12-31 ENCOUNTER — Observation Stay
Admission: AD | Admit: 2017-12-31 | Discharge: 2018-01-01 | Disposition: A | Discharge status: Home / Self Care | Payer: Medicaid Other | Source: Ambulatory Visit | Attending: Nephrology | Admitting: Nephrology

## 2017-12-31 ENCOUNTER — Other Ambulatory Visit: Payer: Self-pay

## 2017-12-31 DIAGNOSIS — Z8673 Personal history of transient ischemic attack (TIA), and cerebral infarction without residual deficits: Secondary | ICD-10-CM | POA: Diagnosis not present

## 2017-12-31 DIAGNOSIS — B1910 Unspecified viral hepatitis B without hepatic coma: Secondary | ICD-10-CM | POA: Diagnosis not present

## 2017-12-31 DIAGNOSIS — I129 Hypertensive chronic kidney disease with stage 1 through stage 4 chronic kidney disease, or unspecified chronic kidney disease: Principal | ICD-10-CM | POA: Insufficient documentation

## 2017-12-31 DIAGNOSIS — N184 Chronic kidney disease, stage 4 (severe): Secondary | ICD-10-CM | POA: Diagnosis present

## 2017-12-31 DIAGNOSIS — R809 Proteinuria, unspecified: Secondary | ICD-10-CM

## 2017-12-31 LAB — CBC
HCT: 32.9 % — ABNORMAL LOW (ref 40.0–52.0)
Hemoglobin: 11.1 g/dL — ABNORMAL LOW (ref 13.0–18.0)
MCH: 27 pg (ref 26.0–34.0)
MCHC: 33.6 g/dL (ref 32.0–36.0)
MCV: 80.2 fL (ref 80.0–100.0)
PLATELETS: 290 10*3/uL (ref 150–440)
RBC: 4.1 MIL/uL — ABNORMAL LOW (ref 4.40–5.90)
RDW: 13.8 % (ref 11.5–14.5)
WBC: 5.6 10*3/uL (ref 3.8–10.6)

## 2017-12-31 MED ORDER — FENTANYL CITRATE (PF) 100 MCG/2ML IJ SOLN
INTRAMUSCULAR | Status: AC | PRN
  Administered 2017-12-31: 50 ug via INTRAVENOUS

## 2017-12-31 MED ORDER — LORAZEPAM 0.5 MG PO TABS
0.5000 mg | ORAL_TABLET | Freq: Once | ORAL | Status: AC
  Administered 2017-12-31: 0.5 mg via ORAL
  Filled 2017-12-31: qty 1

## 2017-12-31 MED ORDER — SODIUM CHLORIDE 0.9 % IV SOLN
INTRAVENOUS | Status: DC
  Administered 2017-12-31 (×2): via INTRAVENOUS

## 2017-12-31 MED ORDER — FENTANYL CITRATE (PF) 100 MCG/2ML IJ SOLN
INTRAMUSCULAR | Status: AC
Start: 2017-12-31 — End: 2017-12-31
  Filled 2017-12-31: qty 2

## 2017-12-31 MED ORDER — FENTANYL CITRATE (PF) 100 MCG/2ML IJ SOLN
INTRAMUSCULAR | Status: AC
  Filled 2017-12-31: qty 2

## 2017-12-31 MED ORDER — MIDAZOLAM HCL 5 MG/5ML IJ SOLN
INTRAMUSCULAR | Status: AC
  Filled 2017-12-31: qty 5

## 2017-12-31 MED ORDER — ACETAMINOPHEN 500 MG PO TABS: 1000.0000 mg | ORAL_TABLET | Freq: Four times a day (QID) | ORAL | Status: DC | PRN

## 2017-12-31 MED ORDER — MIDAZOLAM HCL 5 MG/5ML IJ SOLN
INTRAMUSCULAR | Status: AC | PRN
  Administered 2017-12-31: 1 mg via INTRAVENOUS

## 2017-12-31 MED ORDER — CLONIDINE HCL 0.1 MG PO TABS
0.1000 mg | ORAL_TABLET | Freq: Two times a day (BID) | ORAL | Status: DC
  Administered 2017-12-31 (×2): 0.1 mg via ORAL
  Filled 2017-12-31 (×2): qty 1

## 2017-12-31 MED ORDER — OXYCODONE HCL 5 MG PO TABS: 5.0000 mg | ORAL_TABLET | Freq: Four times a day (QID) | ORAL | Status: DC | PRN

## 2017-12-31 NOTE — Consult Note (Signed)
Chief Complaint: Patient was seen in consultation today for a renal biopsy.  Referring Physician(s): Candiss Norse, H   Patient Status: [facility] - In-pt  History of Present Illness: Dominic Henry is a 42 y.o. male with chronic kidney disease and scheduled for ultrasound-guided renal biopsy with nephrology.  According to nephrology, the patient only had local anesthetic administered into the left flank and a renal biopsy had not been attempted.  Nephrology was not comfortable with the patient's anatomy and requested interventional radiology to evaluate patient for image guided biopsy.  Patient is NPO and already has a inpatient bed.  He is scheduled for 24-hour observation following renal biopsy.  He has no complaints today.  Past Medical History:  Diagnosis Date  . CKD (chronic kidney disease), stage III ([place]) 12/16/2016  . Hypertension 05/21/2013  . Stroke [facility]) 05/21/2014    History reviewed. No pertinent surgical history.  Allergies: Patient has no known allergies.  Medications: Prior to Admission medications   Medication Sig Start Date End Date Taking? Authorizing Provider  amLODipine (NORVASC) 5 MG tablet Take 1 tablet (5 mg total) by mouth daily. For hypertension 12/17/16   Rexene Alberts, MD  benzonatate (TESSALON PERLES) 100 MG capsule Take 2 capsules (200 mg total) by mouth 3 (three) times daily as needed. 10/16/17 10/16/18  Sable Feil, PA-C  benzonatate (TESSALON) 100 MG capsule Take 1 capsule (100 mg total) by mouth 3 (three) times daily as needed for cough. 12/16/16   Rexene Alberts, MD  chlorpheniramine-HYDROcodone [facility] PENNKINETIC ER) 10-8 MG/5ML SUER Take 5 mLs by mouth 2 (two) times daily. 10/16/17   Sable Feil, PA-C  dexamethasone (DECADRON) 4 MG tablet Take 1 tablet (4 mg total) by mouth 2 (two) times daily with a meal. 10/07/17   Lily Kocher, PA-C  diphenhydrAMINE (BENADRYL) 25 MG tablet Take 25 mg by mouth every 6 (six) hours as needed for itching or  allergies.    [provider]  doxycycline (VIBRAMYCIN) 100 MG capsule Take 1 capsule (100 mg total) by mouth 2 (two) times daily. 10/07/17   Lily Kocher, PA-C  HYDROcodone-acetaminophen (NORCO/VICODIN) 5-325 MG tablet Take 1-2 tablets by mouth every 4 (four) hours as needed. 10/07/17   Lily Kocher, PA-C  HYDROcodone-homatropine [facility]) 5-1.5 MG/5ML syrup Take 5 mLs by mouth every 6 (six) hours as needed. 10/07/17   Lily Kocher, PA-C  ipratropium-albuterol (DUONEB) 0.5-2.5 (3) MG/3ML SOLN Take 3 mLs by nebulization every 4 (four) hours as needed. 10/28/17   Fisher, Linden Dolin, PA-C  methylPREDNISolone (MEDROL DOSEPAK) 4 MG TBPK tablet Take 6 pills on day one then decrease by 1 pill each day 10/28/17   Versie Starks, PA-C  metoprolol succinate (TOPROL-XL) 100 MG 24 hr tablet Take 1 tablet (100 mg total) by mouth daily. Take with or immediately following a meal. 12/16/16   Rexene Alberts, MD  omeprazole (PRILOSEC) 40 MG capsule Take 1 capsule (40 mg total) by mouth daily. 10/28/17   Fisher, Linden Dolin, PA-C  vitamin B-12 (CYANOCOBALAMIN) 1000 MCG tablet Take 1 tablet (1,000 mcg total) by mouth daily. 12/16/16   Rexene Alberts, MD     History reviewed. No pertinent family history.  Social History   Socioeconomic History  . Marital status: Married    Spouse name: Not on file  . Number of children: Not on file  . Years of education: Not on file  . Highest education level: Not on file  Occupational History  . Not on file  Social Needs  .  Financial resource strain: Not on file  . Food insecurity:    Worry: Not on file    Inability: Not on file  . Transportation needs:    Medical: Not on file    Non-medical: Not on file  Tobacco Use  . Smoking status: Never Smoker  . Smokeless tobacco: Never Used  Substance and Sexual Activity  . Alcohol use: No  . Drug use: No  . Sexual activity: Not on file  Lifestyle  . Physical activity:    Days per week: Not on file    Minutes per  session: Not on file  . Stress: Not on file  Relationships  . Social connections:    Talks on phone: Not on file    Gets together: Not on file    Attends religious service: Not on file    Active member of club or organization: Not on file    Attends meetings of clubs or organizations: Not on file    Relationship status: Not on file  Other Topics Concern  . Not on file  Social History Narrative  . Not on file     Review of Systems  Respiratory: Negative.   Cardiovascular: Negative.   Gastrointestinal: Negative.   Genitourinary: Negative.     Vital Signs: BP 121/72 (BP Location: Left Arm)   Pulse 66   Temp (!) 97.5 F (36.4 C) (Oral)   Resp 18   Ht 5\' 9"  (1.753 m)   Wt 115.8 kg   SpO2 96%   BMI 37.72 kg/m   Physical Exam  Constitutional: No distress.  HENT:  Mouth/Throat: Oropharynx is clear and moist.  Cardiovascular: Normal rate, regular rhythm and normal heart sounds.  Pulmonary/Chest: Effort normal and breath sounds normal.  Abdominal: Soft. Bowel sounds are normal.  Vitals reviewed.   Imaging: US Renal  Result Date: 12/11/2017 CLINICAL DATA:  Stage 4 chronic kidney disease. EXAM: RENAL / URINARY TRACT ULTRASOUND COMPLETE COMPARISON:  None. FINDINGS: Right Kidney: Length: 9.2 cm.  Diffusely echogenic.  No mass or hydronephrosis. Left Kidney: Length: 10.7 cm.  Diffusely echogenic.  No mass or hydronephrosis. Bladder: Appears normal for degree of bladder distention. IMPRESSION: 1. Echogenic kidneys, compatible with medical renal disease. 2. No acute abnormality. Electronically Signed   By: Claudie Revering M.D.   On: 12/11/2017 16:48    Labs:  CBC: Recent Labs    10/28/17 1315 11/29/17 1032 12/30/17 1155  WBC 5.3 5.3 5.3  HGB 12.3* 11.7* 11.9*  HCT 37.5* 35.5* 36.0*  PLT 249 379 325    COAGS: Recent Labs    12/30/17 1155  INR 0.89    BMP: Recent Labs    10/28/17 1315 11/29/17 1032 12/30/17 1155  NA 136 139 136  K 4.8 4.4 4.4  CL 108 108 110   CO2 19* 22 20*  GLUCOSE 106* 109* 109*  BUN 49* 34* 43*  CALCIUM 8.9 8.9  9.3 8.8*  CREATININE 4.31* 3.16* 3.65*  GFRNONAA 15* 22* 19*  GFRAA 18* 26* 22*    LIVER FUNCTION TESTS: Recent Labs    10/28/17 1315 11/29/17 1032 12/30/17 1155  BILITOT 0.7 0.5 0.6  AST 33 23 27  ALT 35 35 46*  ALKPHOS 83 87 85  PROT 8.0 7.5 7.7  ALBUMIN 3.9 3.7 3.8    TUMOR MARKERS: No results for input(s): AFPTM, CEA, CA199, CHROMGRNA in the last 8760 hours.  Assessment and Plan:  42 year old with chronic kidney disease and scheduled for random renal biopsy.  Nephrology  has requested Interventional Radiology to perform the random renal biopsy.  I evaluated the patient's kidneys with ultrasound.  Patient has echogenic kidneys but does appear to be a percutaneous window for ultrasound-guided biopsy of the left kidney lower pole.  I would prefer to perform the procedure with moderate sedation.  Explained the renal biopsy and moderate sedation to the patient and informed consent was obtained.  Plan for ultrasound-guided random renal biopsy with moderate sedation.  Thank you for this interesting consult.  I greatly enjoyed meeting [employer] and look forward to participating in their care.  A copy of this report was sent to the requesting provider on this date.  Electronically Signed: Burman Riis, MD 12/31/2017, 11:18 AM   I spent a total of 20 Minutes    in face to face in clinical consultation, greater than 50% of which was counseling/coordinating care for a renal biopsy.

## 2017-12-31 NOTE — H&P (Signed)
[facility], [place] 12/31/17  Subjective:   Patient admitted for kidney biopsy Doing well No c/o. No fever, chills, No edema   Objective:  Vital signs in last 24 hours:  Temp:  [97.5 F (36.4 C)] 97.5 F (36.4 C) (08/13 0710) Pulse Rate:  [63] 63 (08/13 0710) Resp:  [18] 18 (08/13 0710) BP: (163)/(98) 163/98 (08/13 0710) SpO2:  [100 %] 100 % (08/13 0710) Weight:  [115.8 kg] 115.8 kg (08/13 0710)  Weight change:  Filed Weights   12/31/17 0710  Weight: 115.8 kg    Intake/Output:   No intake or output data in the 24 hours ending 12/31/17 0832   Physical Exam: General: NAD, Laying in bed  HEENT anicteric  Neck supple  Pulm/lungs Clear b/l  CVS/Heart regular  Abdomen:  Soft, NT  Extremities: No edema  Neurologic: Alert, oriented  Skin: No rashes          Basic Metabolic Panel:  Recent Labs  Lab 12/30/17 1155  NA 136  K 4.4  CL 110  CO2 20*  GLUCOSE 109*  BUN 43*  CREATININE 3.65*  CALCIUM 8.8*     CBC: Recent Labs  Lab 12/30/17 1155  WBC 5.3  HGB 11.9*  HCT 36.0*  MCV 79.4*  PLT 325      Lab Results  Component Value Date   HEPBSAG Negative 11/29/2017      Microbiology:  No results found for this or any previous visit (from the past 240 hour(s)).  Coagulation Studies: Recent Labs    12/30/17 1155  LABPROT 12.0  INR 0.89    Urinalysis: Recent Labs    12/30/17 1155  COLORURINE YELLOW*  LABSPEC 1.012  PHURINE 5.0  GLUCOSEU NEGATIVE  HGBUR NEGATIVE  BILIRUBINUR NEGATIVE  KETONESUR NEGATIVE  PROTEINUR 100*  NITRITE NEGATIVE  LEUKOCYTESUR NEGATIVE      Imaging: No results found.   Medications:   . sodium chloride     . cloNIDine  0.1 mg Oral BID  . LORazepam  0.5 mg Oral Once     Assessment/ Plan:  42 y.o. African American  male with HTN, Proteinuria and CKD-4  1. CKD 4 and proteinuria Cause not clear. Serologies negative Renal biopsy today  2. HTN - amlodipine,  losartan, metoprolol  Results for Dominic Henry, Dominic Henry (MRN [id number]) as of 12/31/2017 08:51  Ref. Range 11/29/2017 10:32  Anti Nuclear Antibody(ANA) Latest Ref Range: Negative  Negative  GBM Ab Latest Ref Range: 0 - 20 units 3  Cytoplasmic (C-ANCA) Latest Ref Range: Neg:<1:20 titer <1:20  P-ANCA Latest Ref Range: Neg:<1:20 titer <1:20  Atypical P-ANCA titer Latest Ref Range: Neg:<1:20 titer <1:20  C3 Complement Latest Ref Range: 82 - 167 mg/dL 173 (H)  Complement C4, Body Fluid Latest Ref Range: 14 - 44 mg/dL 74 (H)   Results for Dominic Henry, Dominic Henry (MRN [id number]) as of 12/31/2017 08:51  Ref. Range 12/14/2016 01:11 11/29/2017 10:32  RPR Latest Ref Range: Non Reactive   Non Reactive  Hepatitis B Surface Ag Latest Ref Range: Negative   Negative  Hep B Core Ab, Tot Latest Ref Range: Negative   Negative  Hepatitis B-Post Latest Ref Range: Immunity>9.9 mIU/mL  13.5  HCV Ab Latest Ref Range: 0.0 - 0.9 s/co ratio  <0.1  HIV Screen 4th Generation wRfx Latest Ref Range: Non Reactive  Non Reactive      LOS: 0 Dominic Henry 8/13/20198:32 AM  [facility] [place], [place]  Note: This note was  prepared with [employer] . Any transcription errors are unintentional

## 2017-12-31 NOTE — Procedures (Signed)
  Pre-operative Diagnosis: CKD Stage IV       Post-operative Diagnosis: CKD Stage IV   Indications: Needs renal biopsy  Procedure: US guided random renal biopsy - left kidney  Findings: Echogenic kidneys.  3 cores from left kidney lower pole.  No hematoma or bleeding  Complications: None     EBL: Minimal  Plan: Bedrest today.  Admitted to nephrology.

## 2017-12-31 NOTE — Progress Notes (Signed)
PROCEDURE: Informed written consent was obtained from the patient after a discussion of the risks, benefits and alternatives to treatment. The patient understands and consents the procedure. A timeout was performed prior to the initiation of the procedure.  Ultrasound scanning was performed of the bilateral flanks. The inferior pole of the LEFT kidney was selected for biopsy due to location and sonographic window. The procedure was planned. The operative site was prepped and draped in the usual sterile fashion. The overlying soft tissues were anesthetized with 8 mL of 1% XYLOCAINE.    Procedure was cancelled because a good sonographic window could not be obtained Interventional Radiology was consulted. Dr Anselm Pancoast has graciously accepted to do the procedure under.

## 2017-12-31 NOTE — Plan of Care (Signed)
Patient voided for 1st time after biopsy and urine was bloody as expected.  Patient/family educated.  Informed of Dr. Request to collect string of urine samples and why.  1st sample placed in bathroom.

## 2018-01-01 DIAGNOSIS — I129 Hypertensive chronic kidney disease with stage 1 through stage 4 chronic kidney disease, or unspecified chronic kidney disease: Secondary | ICD-10-CM | POA: Diagnosis not present

## 2018-01-01 LAB — CBC
HEMATOCRIT: 30.9 % — AB (ref 40.0–52.0)
Hemoglobin: 10.2 g/dL — ABNORMAL LOW (ref 13.0–18.0)
MCH: 26.7 pg (ref 26.0–34.0)
MCHC: 33.2 g/dL (ref 32.0–36.0)
MCV: 80.5 fL (ref 80.0–100.0)
Platelets: 272 10*3/uL (ref 150–440)
RBC: 3.84 MIL/uL — ABNORMAL LOW (ref 4.40–5.90)
RDW: 13.7 % (ref 11.5–14.5)
WBC: 4.8 10*3/uL (ref 3.8–10.6)

## 2018-01-01 NOTE — Discharge Summary (Signed)
[facility], [place] 01/01/18  Subjective:  Underwent kidney biopsy yesterday States urine is clear now Able to ambulate in hallways No pain  Objective:  Vital signs in last 24 hours:  Temp:  [98 F (36.7 C)-98.4 F (36.9 C)] 98 F (36.7 C) (08/14 0411) Pulse Rate:  [54-70] 54 (08/14 0411) Resp:  [12-20] 20 (08/14 0411) BP: (101-129)/(67-86) 129/72 (08/14 0411) SpO2:  [95 %-100 %] 98 % (08/14 0411)  Weight change:  Filed Weights   12/31/17 0710  Weight: 115.8 kg    Intake/Output:    Intake/Output Summary (Last 24 hours) at 01/01/2018 0917 Last data filed at 01/01/2018 1607 Gross per 24 hour  Intake 2683 ml  Output 1175 ml  Net 1508 ml    Physical Exam: General: NAD, Laying in bed  HEENT anicteric  Neck supple  Pulm/lungs Clear b/l  CVS/Heart regular  Abdomen:  Soft, NT  Extremities: No edema  Neurologic: Alert, oriented  Skin: No rashes      Basic Metabolic Panel:  Recent Labs  Lab 12/30/17 1155  NA 136  K 4.4  CL 110  CO2 20*  GLUCOSE 109*  BUN 43*  CREATININE 3.65*  CALCIUM 8.8*     CBC: Recent Labs  Lab 12/30/17 1155 12/31/17 1654 01/01/18 0301  WBC 5.3 5.6 4.8  HGB 11.9* 11.1* 10.2*  HCT 36.0* 32.9* 30.9*  MCV 79.4* 80.2 80.5  PLT 325 290 272      Lab Results  Component Value Date   HEPBSAG Negative 11/29/2017      Microbiology:  No results found for this or any previous visit (from the past 240 hour(s)).  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 Request for random renal biopsy. EXAM:  ULTRASOUND-GUIDED LEFT RENAL BIOPSY MEDICATIONS: None. ANESTHESIA/SEDATION: Moderate (conscious) sedation was employed during this procedure. A total of Versed 1 mg and Fentanyl 50 mcg was administered intravenously. Moderate Sedation Time: 17 minutes. The patient's level of consciousness and vital signs were monitored continuously by radiology nursing throughout the procedure under my direct supervision. FLUOROSCOPY TIME:  None COMPLICATIONS: None immediate. PROCEDURE: Informed written consent was obtained from the patient after a thorough discussion of the procedural risks, benefits and alternatives. All questions were addressed. Maximal Sterile Barrier Technique was utilized including mask, sterile gowns, sterile gloves, sterile drape, hand hygiene and skin antiseptic. A timeout was performed prior to the initiation of the procedure. Patient was placed prone. Left kidney was selected for biopsy. The left flank was prepped with chlorhexidine and sterile field was created. Skin and soft tissues anesthetized with 1% lidocaine. Using ultrasound guidance, 16 gauge core needle was directed into the left kidney lower pole cortex and core biopsies were obtained. A total of 3 core biopsies were obtained. Specimens placed on a Telfa pad. Patient tolerated the procedure well without complication. Bandage placed over the puncture site. FINDINGS: Echogenic kidneys. Left kidney was selected for biopsy. Biopsies obtained from the left kidney lower pole. No evidence for bleeding or hematoma formation following the core biopsies. IMPRESSION: Ultrasound-guided core biopsies of left kidney lower pole.  Electronically Signed   By: Markus Daft M.D.   On: 12/31/2017 13:13     Medications:   . sodium chloride 75 mL/hr at 12/31/17 2113    acetaminophen, oxyCODONE  Assessment/ Plan:  42 y.o. male male with HTN, Proteinuria and CKD-4  1. CKD 4 and proteinuria Cause not clear. Serologies negative. DDX FSGS vs HTN  nephrosclerosis Renal biopsy done 8/13 Instructed to not lift over 15 lbs for 2 weeks Tylenol for pain PRN Follow up 8/22  2. HTN - amlodipine, losartan, metoprolol No changes   LOS: 0 Sajid Ruppert Candiss Norse 8/14/20199:17 AM  [place], [place]  Note: This note was prepared with Dragon dictation. Any transcription errors are unintentional

## 2018-01-01 NOTE — Discharge Instructions (Signed)
Percutaneous Kidney Biopsy °A kidney biopsy is a procedure to remove small pieces of tissue from a kidney. In a percutaneous biopsy, the tissue is removed using a needle that is inserted through the skin. This procedure is done so that the tissue can be examined under a microscope and checked for disease or infection. °Tell a health care provider about: °· Any allergies you have. °· All medicines you are taking, including vitamins, herbs, eye drops, creams, and over-the-counter medicines. °· Any problems you or family members have had with anesthetic medicines. °· Any blood disorders you have. °· Any surgeries you have had. °· Any medical conditions you have. °· Whether you are pregnant or may be pregnant. °What are the risks? °Generally, this is a safe procedure. However, problems may occur, including: °· Infection. °· Bleeding. °· Allergic reactions to medicines. °· Damage to other structures or organs. °· Swelling from a collection of clotted blood outside a blood vessel (hematoma). °· Blood in the urine (hematuria). ° °What happens before the procedure? °· Follow instructions from your health care provider about eating or drinking restrictions. °· Ask your health care provider about: °? Changing or stopping your regular medicines. This is especially important if you are taking diabetes medicines or blood thinners. °? Taking medicines such as aspirin and ibuprofen. These medicines can thin your blood. Do not take these medicines before your procedure if your health care provider instructs you not to. °· You may be given antibiotic medicine to help prevent infection. °· You will have blood and urine samples taken. This is to make sure that you do not have a condition where you should not have a biopsy. °· Plan to have someone take you home from the hospital or clinic. °· Ask your health care provider how your biopsy site will be marked or identified. °What happens during the procedure? °· To lower your risk of  infection: °? Your health care team will wash or sanitize their hands. °? Your skin will be washed with soap. °· An IV tube will be inserted into one of your veins. °· You will be given one or more of the following: °? A medicine to help you relax (sedative). °? A medicine to numb the area (local anesthetic). °· You will lie on your abdomen. A firm pillow will be placed under your body to help push the kidneys closer to the surface of the skin. If you have a transplanted kidney, you will lie on your back. °· The health care provider will mark the area where the needle will enter your skin. °· An imaging test--such as an ultrasound, X-ray, CT scan, or MRI--will be used to locate the kidney. These images will also help the health care provider to guide the biopsy needle into the kidney. °· You will be asked to hold your breath and stay still while the health care provider inserts the needle and removes the kidney tissue. °? You will need to hold your breath and stay still for 30-45 seconds. °? During the biopsy, you may hear a popping sound from the needle. °? You may also feel some pressure from the area where the needle is being inserted. °· The needle may be inserted and removed 3 or 4 times to make sure that enough tissue is taken for testing. °· A bandage (dressing) may be placed over the spot where the needle entered your skin (biopsy site). °The procedure may vary among health care providers and hospitals. °What happens after the procedure? °·   Your blood pressure, heart rate, breathing rate, and blood oxygen level will be monitored until the medicines you were given have worn off.  You will need to lie on your back for 6-8 hours.  You may have some pain or soreness near the biopsy site.  You may have pink or cloudy urine from small amounts of blood. This is normal.  You may have grogginess or fatigue if you were given a sedative.  Do not drive for 24 hours if you were given a sedative.  It is up to  you to get the results of your procedure. Ask your health care provider, or the department performing the procedure, when your results will be ready. This information is not intended to replace advice given to you by your health care provider. Make sure you discuss any questions you have with your health care provider. Document Released: 03/17/2004 Document Revised: 02/17/2016 Document Reviewed: 02/17/2016 Elsevier Interactive Patient Education  [employer].

## 2018-01-01 NOTE — Progress Notes (Signed)
IV was removed. Discharge instructions and follow-up appointments were provided to the pt and wife at bedside. The pt was taken downstairs via wheelchair by volunteer services.All questions answered.

## 2018-01-06 ENCOUNTER — Encounter: Payer: Self-pay | Admitting: Nephrology

## 2018-01-06 LAB — SURGICAL PATHOLOGY

## 2018-01-28 ENCOUNTER — Other Ambulatory Visit: Payer: Self-pay | Admitting: Nephrology

## 2018-01-28 DIAGNOSIS — N041 Nephrotic syndrome with focal and segmental glomerular lesions: Secondary | ICD-10-CM

## 2018-02-05 ENCOUNTER — Ambulatory Visit
Admission: RE | Admit: 2018-02-05 | Discharge: 2018-02-05 | Disposition: A | Discharge status: Home / Self Care | Payer: Medicaid Other | Source: Ambulatory Visit | Attending: Nephrology | Admitting: Nephrology

## 2018-02-05 DIAGNOSIS — K5730 Diverticulosis of large intestine without perforation or abscess without bleeding: Secondary | ICD-10-CM | POA: Diagnosis not present

## 2018-02-05 DIAGNOSIS — N041 Nephrotic syndrome with focal and segmental glomerular lesions: Secondary | ICD-10-CM | POA: Diagnosis present

## 2018-02-05 DIAGNOSIS — N62 Hypertrophy of breast: Secondary | ICD-10-CM | POA: Diagnosis not present

## 2018-10-08 IMAGING — US US RENAL
1 series · 14 of 25 positions shown · non-contrast
Comparison: None.

CLINICAL DATA: Stage 4 chronic kidney disease.

EXAM:
RENAL / URINARY TRACT ULTRASOUND COMPLETE

[Series 1: us renal · 0.25mm/px · 14 of 31 slices shown]
[im 1/31]
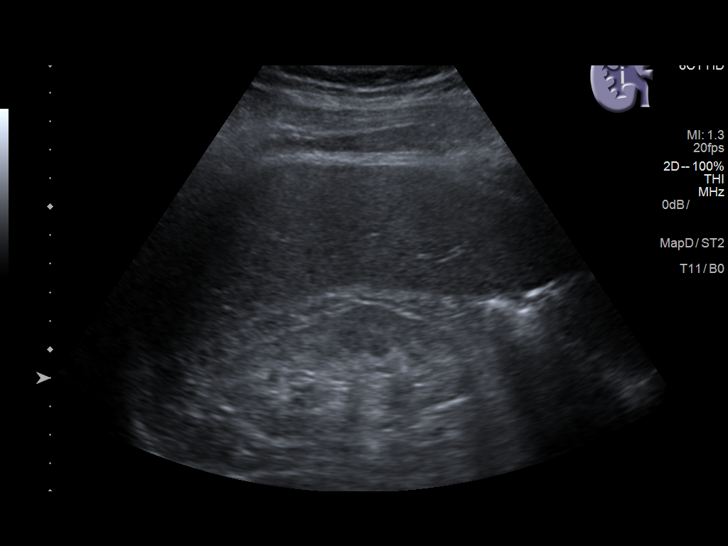
[im 3/31]
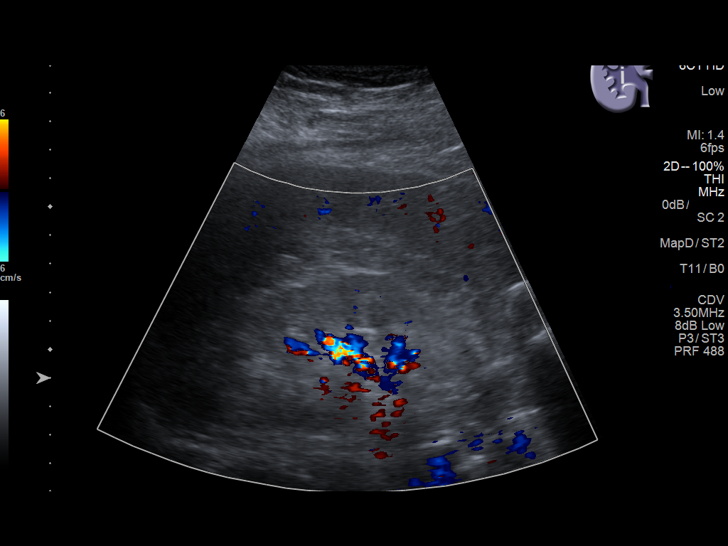
[im 6/31]
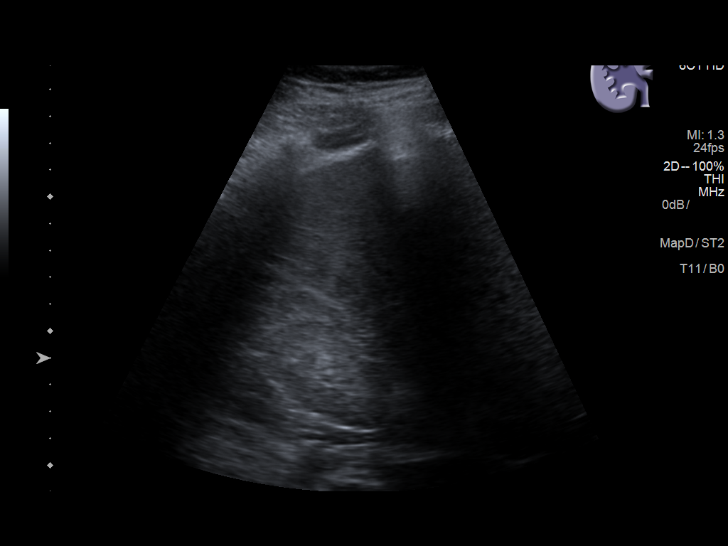
[im 8/31]
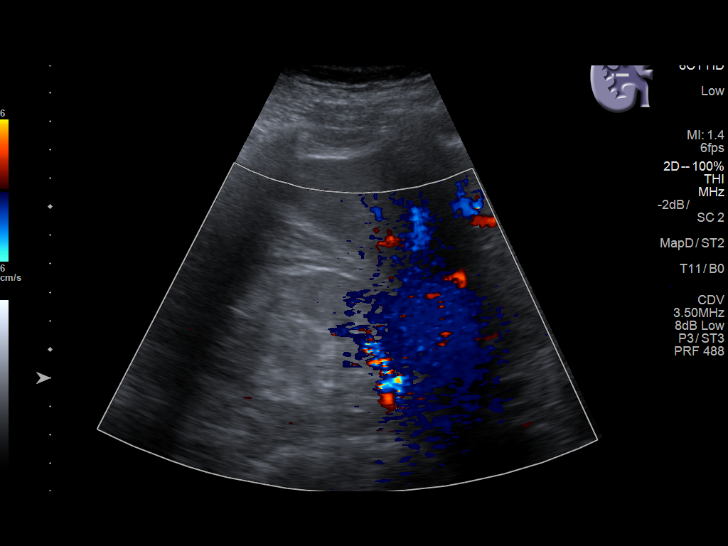
[im 11/31]
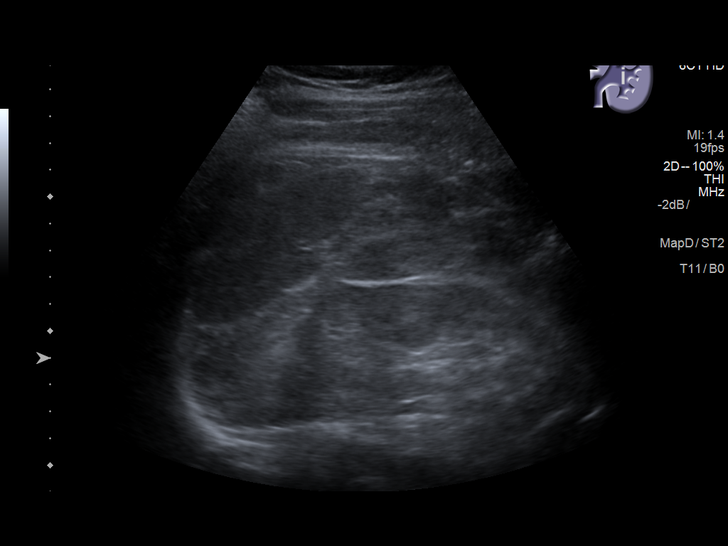
[im 12/31]
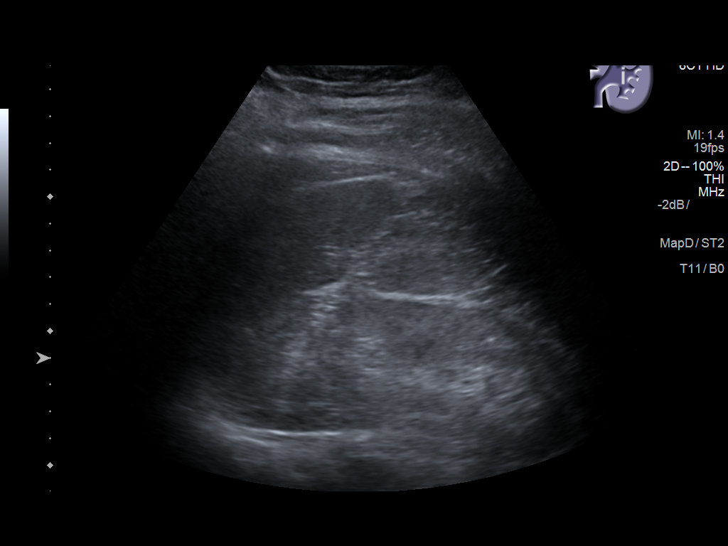
[im 14/31]
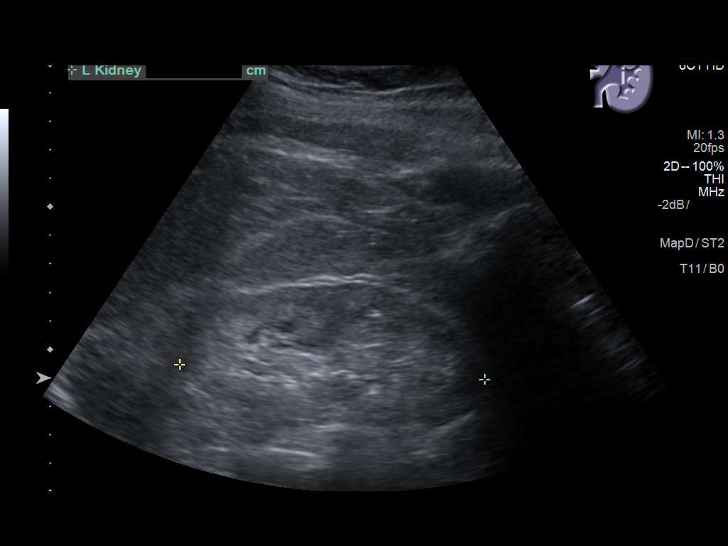
[im 17/31]
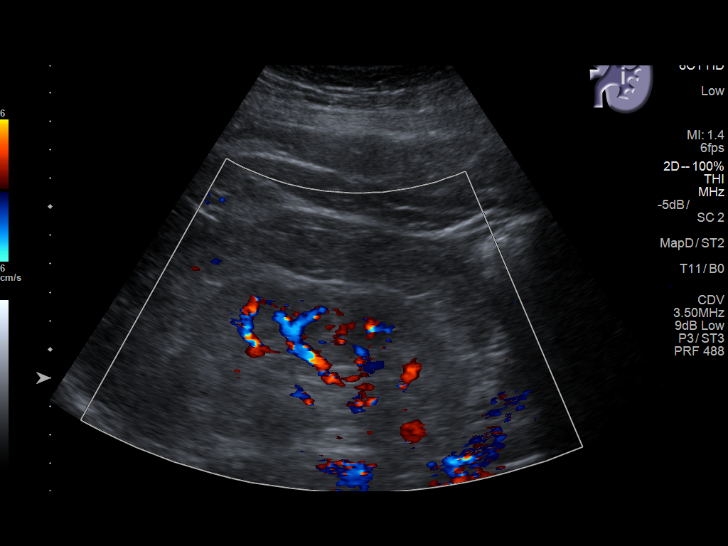
[im 19/31]
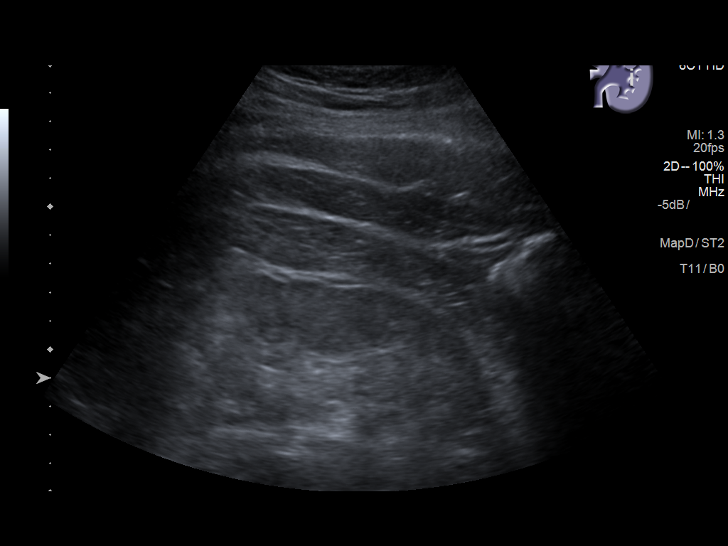
[im 21/31]
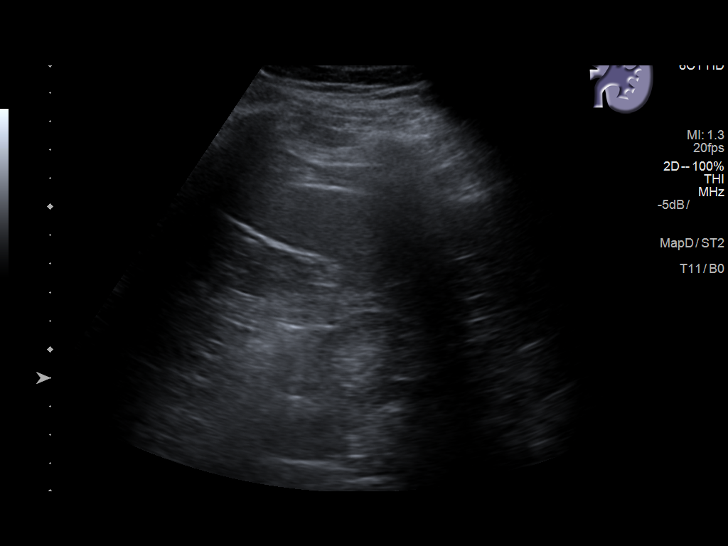
[im 23/31]
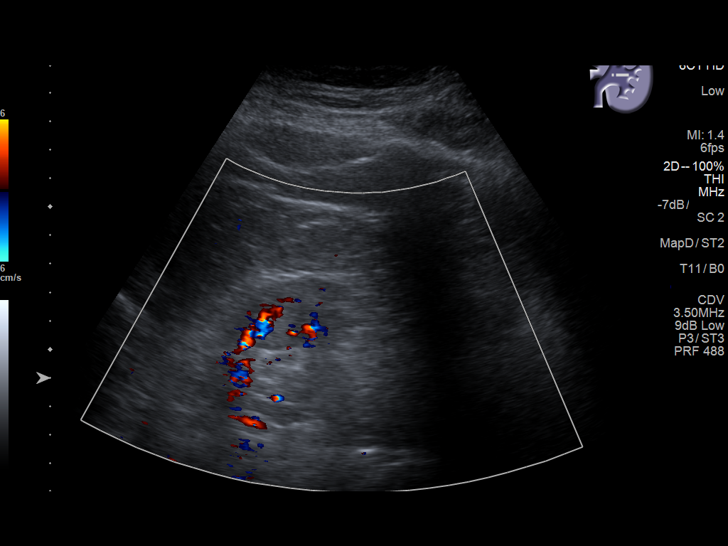
[im 26/31]
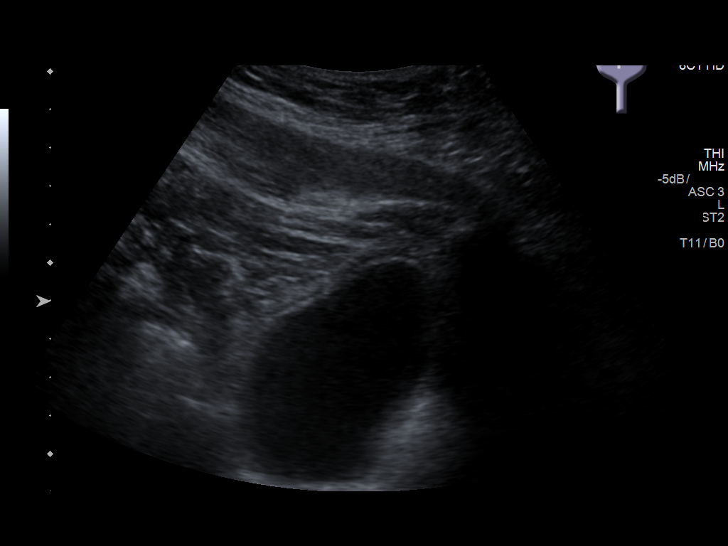
[im 28/31]
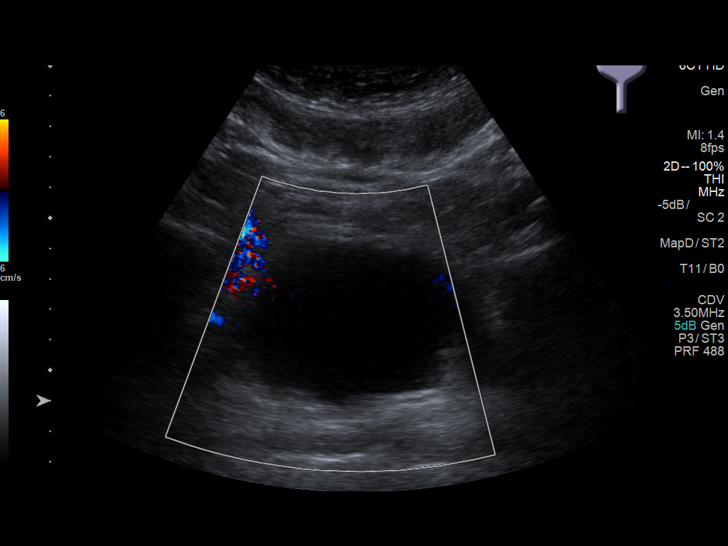
[im 31/31]
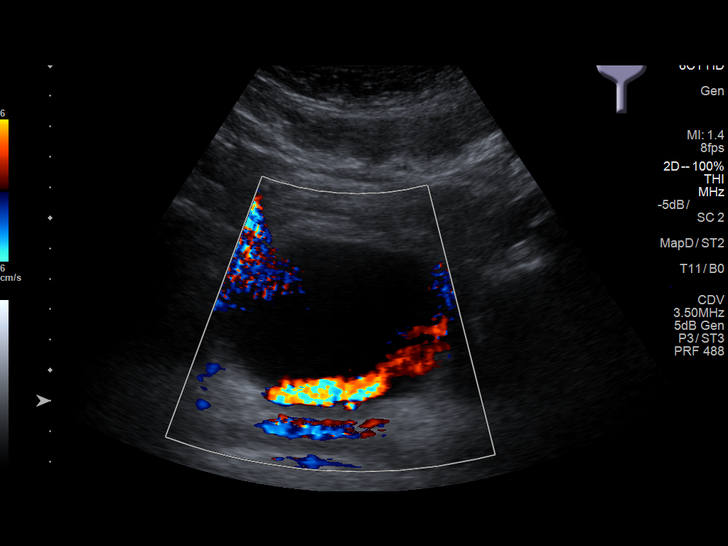

[14 of 25 positions shown; findings below may reference images not displayed]

FINDINGS: Right Kidney:

Length: 9.2 cm.  Diffusely echogenic.  No mass or hydronephrosis.

Left Kidney:

Length: 10.7 cm.  Diffusely echogenic.  No mass or hydronephrosis.

Bladder:

Appears normal for degree of bladder distention.
IMPRESSION: 1. Echogenic kidneys, compatible with medical renal disease.
2. No acute abnormality.

## 2018-12-12 ENCOUNTER — Other Ambulatory Visit: Payer: Self-pay

## 2018-12-12 ENCOUNTER — Emergency Department
Admission: EM | Admit: 2018-12-12 | Discharge: 2018-12-13 | Disposition: A | Discharge status: Home / Self Care | Payer: Medicaid Other | Attending: Emergency Medicine | Admitting: Emergency Medicine

## 2018-12-12 ENCOUNTER — Emergency Department: Payer: Medicaid Other

## 2018-12-12 DIAGNOSIS — I1310 Hypertensive heart and chronic kidney disease without heart failure, with stage 1 through stage 4 chronic kidney disease, or unspecified chronic kidney disease: Secondary | ICD-10-CM | POA: Insufficient documentation

## 2018-12-12 DIAGNOSIS — Z79899 Other long term (current) drug therapy: Secondary | ICD-10-CM | POA: Insufficient documentation

## 2018-12-12 DIAGNOSIS — N183 Chronic kidney disease, stage 3 (moderate): Secondary | ICD-10-CM | POA: Insufficient documentation

## 2018-12-12 DIAGNOSIS — I10 Essential (primary) hypertension: Secondary | ICD-10-CM

## 2018-12-12 DIAGNOSIS — R519 Headache, unspecified: Secondary | ICD-10-CM

## 2018-12-12 DIAGNOSIS — R51 Headache: Secondary | ICD-10-CM | POA: Insufficient documentation

## 2018-12-12 LAB — CBC WITH DIFFERENTIAL/PLATELET
Abs Immature Granulocytes: 0.04 10*3/uL (ref 0.00–0.07)
Basophils Absolute: 0 10*3/uL (ref 0.0–0.1)
Basophils Relative: 0 %
Eosinophils Absolute: 0 10*3/uL (ref 0.0–0.5)
Eosinophils Relative: 0 %
HCT: 41.5 % (ref 39.0–52.0)
Hemoglobin: 12.8 g/dL — ABNORMAL LOW (ref 13.0–17.0)
Immature Granulocytes: 1 %
Lymphocytes Relative: 13 %
Lymphs Abs: 1.1 10*3/uL (ref 0.7–4.0)
MCH: 25.2 pg — ABNORMAL LOW (ref 26.0–34.0)
MCHC: 30.8 g/dL (ref 30.0–36.0)
MCV: 81.7 fL (ref 80.0–100.0)
Monocytes Absolute: 0.3 10*3/uL (ref 0.1–1.0)
Monocytes Relative: 4 %
Neutro Abs: 6.7 10*3/uL (ref 1.7–7.7)
Neutrophils Relative %: 82 %
Platelets: 328 10*3/uL (ref 150–400)
RBC: 5.08 MIL/uL (ref 4.22–5.81)
RDW: 12.7 % (ref 11.5–15.5)
WBC: 8.1 10*3/uL (ref 4.0–10.5)
nRBC: 0 % (ref 0.0–0.2)

## 2018-12-12 LAB — BASIC METABOLIC PANEL
Anion gap: 9 (ref 5–15)
BUN: 45 mg/dL — ABNORMAL HIGH (ref 6–20)
CO2: 21 mmol/L — ABNORMAL LOW (ref 22–32)
Calcium: 9.4 mg/dL (ref 8.9–10.3)
Chloride: 107 mmol/L (ref 98–111)
Creatinine, Ser: 3.56 mg/dL — ABNORMAL HIGH (ref 0.61–1.24)
GFR calc Af Amer: 23 mL/min — ABNORMAL LOW (ref >60)
GFR calc non Af Amer: 19 mL/min — ABNORMAL LOW (ref >60)
Glucose, Bld: 108 mg/dL — ABNORMAL HIGH (ref 70–99)
Potassium: 5 mmol/L (ref 3.5–5.1)
Sodium: 137 mmol/L (ref 135–145)

## 2018-12-12 NOTE — ED Triage Notes (Signed)
Pt states generalized headache since 4 pm today. Pt appears in no acute distress, but states this is the worse headache he has ever had. Pt states began after he took his blood pressure medication this pm. Pt denies visual change, fever, nausea.

## 2018-12-13 LAB — URINALYSIS, COMPLETE (UACMP) WITH MICROSCOPIC
Bacteria, UA: NONE SEEN
Bilirubin Urine: NEGATIVE
Glucose, UA: NEGATIVE mg/dL
Hgb urine dipstick: NEGATIVE
Ketones, ur: NEGATIVE mg/dL
Leukocytes,Ua: NEGATIVE
Nitrite: NEGATIVE
Protein, ur: 100 mg/dL — AB
Specific Gravity, Urine: 1.012 (ref 1.005–1.030)
pH: 5 (ref 5.0–8.0)

## 2018-12-13 MED ORDER — PROCHLORPERAZINE EDISYLATE 10 MG/2ML IJ SOLN
2.5000 mg | Freq: Once | INTRAMUSCULAR | Status: AC
  Administered 2018-12-13: 01:00:00 2.5 mg via INTRAVENOUS
  Filled 2018-12-13: qty 2

## 2018-12-13 MED ORDER — DIPHENHYDRAMINE HCL 50 MG/ML IJ SOLN
12.5000 mg | Freq: Once | INTRAMUSCULAR | Status: AC
  Administered 2018-12-13: 12.5 mg via INTRAVENOUS
  Filled 2018-12-13: qty 1

## 2018-12-13 MED ORDER — SODIUM CHLORIDE 0.9 % IV BOLUS
500.0000 mL | Freq: Once | INTRAVENOUS | Status: AC
  Administered 2018-12-13: 500 mL via INTRAVENOUS

## 2018-12-13 MED ORDER — PROCHLORPERAZINE MALEATE 10 MG PO TABS: 10.0000 mg | ORAL_TABLET | Freq: Four times a day (QID) | ORAL | 0 refills | Status: DC | PRN

## 2018-12-13 NOTE — Discharge Instructions (Addendum)
1.  You may take Compazine as needed for headache. 2.  Take your blood pressure medicines daily as directed by your doctor.  Do not take extra pills all at once. 3.  Return to the ER for worsening symptoms, persistent vomiting, difficulty breathing or other concerns.

## 2018-12-13 NOTE — ED Provider Notes (Signed)
[facility] Emergency Department Provider Note   ____________________________________________   First MD Initiated Contact with Patient 12/13/18 0012     (approximate)  I have reviewed the triage vital signs and the nursing notes.   HISTORY  Chief Complaint Headache    HPI Dominic Henry is a 43 y.o. male who presents to the ED from home with a chief complaint of headache.  Patient reports frontal headache around 4 PM while at rest.  Developed after he took 3 tablets at the same time of his hydralazine 25 mg.  He does tell me he felt like he took too much blood pressure medicine.  Worse headache of his life; without further intervention, patient now states headache is pretty much resolved, just residual posterior pain.  Denies fever, vision changes, neck pain, cough, chest pain, shortness of breath, abdominal pain, nausea, vomiting or dizziness.  Denies recent travel, trauma or exposure to persons diagnosed with coronavirus.       Past Medical History:  Diagnosis Date   CKD (chronic kidney disease), stage III ([place]) 12/16/2016   Hypertension 05/21/2013   Stroke ([place]) 05/21/2014    Patient Active Problem List   Diagnosis Date Noted   Proteinuria 12/31/2017   CKD (chronic kidney disease) stage 4, GFR 15-29 ml/min ([place]) 12/16/2016   Vitamin B12 deficiency 12/15/2016   Normocytic anemia 12/14/2016   CAP (community acquired pneumonia) 12/13/2016   AKI (acute kidney injury) ([place]) 12/13/2016   Essential hypertension 12/13/2016    No past surgical history on file.  Prior to Admission medications   Medication Sig Start Date End Date Taking? Authorizing Provider  amLODipine (NORVASC) 5 MG tablet Take 1 tablet (5 mg total) by mouth daily. For hypertension 12/17/16   Rexene Alberts, MD  losartan (COZAAR) 25 MG tablet Take 25 mg by mouth daily. 12/18/17   [provider]  metoprolol succinate (TOPROL-XL) 100 MG 24 hr tablet Take 1  tablet (100 mg total) by mouth daily. Take with or immediately following a meal. 12/16/16   Rexene Alberts, MD  Hydralazine 25mg  tid prn  Allergies Patient has no known allergies.  No family history on file.  Social History Social History   Tobacco Use   Smoking status: Never Smoker   Smokeless tobacco: Never Used  Substance Use Topics   Alcohol use: No   Drug use: No    Review of Systems  Constitutional: No fever/chills Eyes: No visual changes. ENT: No sore throat. Cardiovascular: Denies chest pain. Respiratory: Denies shortness of breath. Gastrointestinal: No abdominal pain.  No nausea, no vomiting.  No diarrhea.  No constipation. Genitourinary: Negative for dysuria. Musculoskeletal: Negative for back pain. Skin: Negative for rash. Neurological: Positive for headache. Negative for focal weakness or numbness.   ____________________________________________   PHYSICAL EXAM:  VITAL SIGNS: ED Triage Vitals  Enc Vitals Group     BP 12/12/18 1936 (!) 190/101     Pulse Rate 12/12/18 1936 (!) 112     Resp 12/12/18 1936 16     Temp 12/12/18 1936 98.8 F (37.1 C)     Temp src --      SpO2 12/12/18 1936 100 %     Weight 12/12/18 1939 260 lb (117.9 kg)     Height 12/12/18 1939 5\' 9"  (1.753 m)     Head Circumference --      Peak Flow --      Pain Score 12/12/18 1939 9     Pain Loc --  Pain Edu? --      Excl. in [place]? --     Constitutional: Alert and oriented. Well appearing and in no acute distress. Eyes: Conjunctivae are normal. PERRL. EOMI. Head: Atraumatic. Nose: No congestion/rhinnorhea. Mouth/Throat: Mucous membranes are moist.  Oropharynx non-erythematous. Neck: No stridor.  No cervical spine tenderness to palpation.  No carotid bruits.  Supple neck without meningismus. Cardiovascular: Normal rate, regular rhythm. Grossly normal heart sounds.  Good peripheral circulation. Respiratory: Normal respiratory effort.  No retractions. Lungs  CTAB. Gastrointestinal: Soft and nontender. No distention. No abdominal bruits. No CVA tenderness. Musculoskeletal: No lower extremity tenderness nor edema.  No joint effusions. Neurologic: Alert and oriented x3.  CN II -XII grossly intact.  Normal speech and language. No gross focal neurologic deficits are appreciated. MAEx4. Skin:  Skin is warm, dry and intact. No rash noted. Psychiatric: Mood and affect are normal. Speech and behavior are normal.  ____________________________________________   LABS (all labs ordered are listed, but only abnormal results are displayed)  Labs Reviewed  CBC WITH DIFFERENTIAL/PLATELET - Abnormal; Notable for the following components:      Result Value   Hemoglobin 12.8 (*)    MCH 25.2 (*)    All other components within normal limits  BASIC METABOLIC PANEL - Abnormal; Notable for the following components:   CO2 21 (*)    Glucose, Bld 108 (*)    BUN 45 (*)    Creatinine, Ser 3.56 (*)    GFR calc non Af Amer 19 (*)    GFR calc Af Amer 23 (*)    All other components within normal limits  URINALYSIS, COMPLETE (UACMP) WITH MICROSCOPIC - Abnormal; Notable for the following components:   Color, Urine YELLOW (*)    APPearance CLEAR (*)    Protein, ur 100 (*)    All other components within normal limits   ____________________________________________  EKG  None ____________________________________________  RADIOLOGY  ED MD interpretation: No ICH  Official radiology report(s): Ct Head Wo Contrast  Result Date: 12/12/2018 CLINICAL DATA:  43 year old male with generalized headache. Concern for intracranial hemorrhage. EXAM: CT HEAD WITHOUT CONTRAST TECHNIQUE: Contiguous axial images were obtained from the base of the skull through the vertex without intravenous contrast. COMPARISON:  None. FINDINGS: Brain: The ventricles and sulci appropriate size for patient's age. The gray-white matter discrimination is preserved. There is no acute intracranial  hemorrhage. No mass effect or midline shift. No extra-axial fluid collection. Vascular: No hyperdense vessel or unexpected calcification. Skull: Normal. Negative for fracture or focal lesion. Sinuses/Orbits: Partially visualized 2 cm left maxillary sinus retention cyst or polyp. The remainder of the visualized paranasal sinuses and mastoid air cells are clear. Other: None IMPRESSION: Unremarkable noncontrast CT of the brain. Electronically Signed   By: Anner Crete M.D.   On: 12/12/2018 20:22    ____________________________________________   PROCEDURES  Procedure(s) performed (including Critical Care):  Procedures   ____________________________________________   INITIAL IMPRESSION / ASSESSMENT AND PLAN / ED COURSE  As part of my medical decision making, I reviewed the following data within the [facility] notes reviewed and incorporated, Labs reviewed, Old chart reviewed and Notes from prior ED visits     Dominic Henry was evaluated in Emergency Department on 12/13/2018 for the symptoms described in the history of present illness. He was evaluated in the context of the global COVID-19 pandemic, which necessitated consideration that the patient might be at risk for infection with the SARS-CoV-2 virus that causes COVID-19.  Institutional protocols and algorithms that pertain to the evaluation of patients at risk for COVID-19 are in a state of rapid change based on information released by regulatory bodies including the CDC and federal and state organizations. These policies and algorithms were followed during the patient's care in the ED.   43 year old male with hypertension and CKD III who presents with frontal headache after taking 3 hydralazine tablets at once. Differential diagnosis includes, but is not limited to, intracranial hemorrhage, meningitis/encephalitis, previous head trauma, cavernous venous thrombosis, tension headache, temporal arteritis,  migraine or migraine equivalent, idiopathic intracranial hypertension, and non-specific headache.  CT is negative for ICH; lab work is stable from baseline.  Patient reports headache is much improved.  Neck is supple and patient does not exhibit focal neurological deficits.  Will repeat blood pressure, administer IV Compazine and Benadryl for headache, and reassess.   Clinical Course as of Dec 12 224  Sat Dec 13, 2018  0224 Patient resting in no acute distress.  Updated him on urinalysis result.  Voicing no complaints of headache.  Will discharge home with prescription for Compazine to use as needed.  Advised him not to take 3 of his hydralazine tablets at once in the future.  Strict return precautions given.  Patient verbalizes understanding agrees with plan of care.   [JS]    Clinical Course User Index [JS] Paulette Blanch, MD     ____________________________________________   FINAL CLINICAL IMPRESSION(S) / ED DIAGNOSES  Final diagnoses:  Essential hypertension  Acute nonintractable headache, unspecified headache type     ED Discharge Orders    None       Note:  This document was prepared using Dragon voice recognition software and may include unintentional dictation errors.   Paulette Blanch, MD 12/13/18 [phone]

## 2019-01-27 ENCOUNTER — Ambulatory Visit (INDEPENDENT_AMBULATORY_CARE_PROVIDER_SITE_OTHER): Payer: BC Managed Care – PPO

## 2019-01-27 ENCOUNTER — Encounter: Payer: Self-pay | Admitting: Emergency Medicine

## 2019-01-27 ENCOUNTER — Ambulatory Visit
Admission: EM | Admit: 2019-01-27 | Discharge: 2019-01-27 | Disposition: A | Discharge status: Home / Self Care | Payer: BC Managed Care – PPO

## 2019-01-27 ENCOUNTER — Other Ambulatory Visit: Payer: Self-pay

## 2019-01-27 DIAGNOSIS — M19079 Primary osteoarthritis, unspecified ankle and foot: Secondary | ICD-10-CM | POA: Diagnosis not present

## 2019-01-27 DIAGNOSIS — M25572 Pain in left ankle and joints of left foot: Secondary | ICD-10-CM

## 2019-01-27 DIAGNOSIS — R937 Abnormal findings on diagnostic imaging of other parts of musculoskeletal system: Secondary | ICD-10-CM

## 2019-01-27 MED ORDER — TRAMADOL HCL 50 MG PO TABS: 50.0000 mg | ORAL_TABLET | Freq: Two times a day (BID) | ORAL | 0 refills | Status: DC | PRN

## 2019-01-27 NOTE — ED Provider Notes (Addendum)
[place], [place]   Name: Dominic Henry DOB: 13-Sep-1975 MRN: [id number] CSN: [id number] PCP: Dominic Athens, MD  Arrival date and time:  01/27/19 1421  Chief Complaint:  Ankle Pain (APPT)   NOTE: Prior to seeing the patient today, I have reviewed the triage nursing documentation and vital signs. Clinical staff has updated patient's PMH/PSHx, current medication list, and drug allergies/intolerances to ensure comprehensive history available to assist in medical decision making.   History:   HPI: Dominic Henry is a 43 y.o. male who presents today with complaints of pain in his LEFT ankle that began approximately 2 days ago. Patient has appreciated some associated swelling. Patient is a poor historian overall. He is unable to advise as to whether or not there was a true injury. Patient states, "I think I may have turned my ankle when I was trying to put a box on a shelf". Patient has been using Aleve with no improvement. He advises that he tried to apply ice yesterday and "could not stand it". Patient denies previous injuries or surgeries to this foot/ankle. He complains of difficulty bearing weight today.   Patient presents to clinic today with an elevated blood pressure of 146/112. Patient on multi-drug therapy for his HTN. He has not taken his antihypertensive medications today.   Past Medical History:  Diagnosis Date  . CKD (chronic kidney disease), stage III ([place]) 12/16/2016  . Hypertension 05/21/2013  . Stroke [facility]) 05/21/2014    Past Surgical History:  Procedure Laterality Date  . NO PAST SURGERIES      Family History  Problem Relation Age of Onset  . Hypertension Mother   . Diabetes Mother   . Hypertension Father     Social History   Tobacco Use  . Smoking status: Never Smoker  . Smokeless tobacco: Never Used  Substance Use Topics  . Alcohol use: No  . Drug use: No    Patient Active Problem List   Diagnosis Date Noted  . Proteinuria 12/31/2017  . CKD  (chronic kidney disease) stage 4, GFR 15-29 ml/min (HCC) 12/16/2016  . Vitamin B12 deficiency 12/15/2016  . Normocytic anemia 12/14/2016  . CAP (community acquired pneumonia) 12/13/2016  . AKI (acute kidney injury) ([place]) 12/13/2016  . Essential hypertension 12/13/2016    Home Medications:    Current Meds  Medication Sig  . amLODipine (NORVASC) 5 MG tablet Take 1 tablet (5 mg total) by mouth daily. For hypertension  . losartan (COZAAR) 25 MG tablet Take 25 mg by mouth daily.  . metoprolol succinate (TOPROL-XL) 100 MG 24 hr tablet Take 1 tablet (100 mg total) by mouth daily. Take with or immediately following a meal.  . prochlorperazine (COMPAZINE) 10 MG tablet Take 1 tablet (10 mg total) by mouth every 6 (six) hours as needed for nausea (headache).    Allergies:   Patient has no known allergies.  Review of Systems (ROS): Review of Systems  Constitutional: Negative for chills and fever.  Respiratory: Negative for cough and shortness of breath.   Cardiovascular: Negative for chest pain and palpitations.  Musculoskeletal:       Acute pain and swelling to LEFT ankle  All other systems reviewed and are negative.    Vital Signs: Today's Vitals   01/27/19 1437 01/27/19 1438 01/27/19 1442 01/27/19 1548  BP:  (!) 159/126 (!) 146/112   Pulse:  86    Resp:  18    Temp:  98.4 F (36.9 C)    TempSrc:  Oral  SpO2:  99%    Weight: 260 lb (117.9 kg)     Height: 5\' 10"  (1.778 m)     PainSc: 9    9     Physical Exam: Physical Exam  Constitutional: He is oriented to person, place, and time and well-developed, well-nourished, and in no distress.  HENT:  Head: Normocephalic and atraumatic.  Mouth/Throat: Mucous membranes are normal.  Eyes: Pupils are equal, round, and reactive to light. EOM are normal.  Neck: Normal range of motion. Neck supple. No tracheal deviation present.  Cardiovascular: Normal rate, regular rhythm, normal heart sounds and intact distal pulses. Exam reveals no  gallop and no friction rub.  No murmur heard. Pulmonary/Chest: Effort normal and breath sounds normal. No respiratory distress. He has no wheezes. He has no rales.  Musculoskeletal:     Left ankle: He exhibits swelling. He exhibits normal range of motion, no ecchymosis, no deformity, no laceration and normal pulse. Tenderness (generalized).  Neurological: He is alert and oriented to person, place, and time. Gait normal.  Skin: Skin is warm and dry. No rash noted.  Psychiatric: Mood, memory, affect and judgment normal.  Nursing note and vitals reviewed.   Urgent Care Treatments / Results:   LABS: PLEASE NOTE: all labs that were ordered this encounter are listed, however only abnormal results are displayed. Labs Reviewed - No data to display  EKG: -None  RADIOLOGY: Dg Ankle Complete Left  Result Date: 01/27/2019 CLINICAL DATA:  Left ankle pain and swelling for 2 days. No known injury. EXAM: LEFT ANKLE COMPLETE - 3+ VIEW COMPARISON:  None. FINDINGS: No fracture or dislocation is present. There is a lucent lesion in the medial metaphysis of the tibia in the anterior cortex measuring 1 cm AP x 0.9 cm craniocaudal. No cortical break or periosteal reaction. The patient has advanced talonavicular osteoarthritis with bulky osteophytosis about the joint. Midfoot degenerative change is also seen. Soft tissues appear normal. Dorsal calcaneal spur is seen. IMPRESSION: No acute abnormality. Lucent lesion in the anterior metaphysis of the medial tibia has benign features but cannot be definitively characterized. Recommend repeat plain films the ankle in 4-6 months to ensure stability. 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 Electronically Signed   By: Inge Rise M.D.   On: 01/27/2019 15:24    PROCEDURES: Procedures  MEDICATIONS RECEIVED THIS VISIT: Medications - No data to display  PERTINENT CLINICAL COURSE NOTES/UPDATES:   Initial Impression / Assessment and  Plan / Urgent Care Course:  Pertinent labs & imaging results that were available during my care of the patient were personally reviewed by me and considered in my medical decision making (see lab/imaging section of note for values and interpretations).  Tau Raman is a 43 y.o. male who presents to [facility] today with complaints of Ankle Pain (APPT)   Patient is well appearing overall in clinic today. He does not appear to be in any acute distress. Presenting symptoms (see HPI) and exam as documented above. Diagnostic plain films of the LEFT ankle revealed to acute fracture or dislocation. There was advances osteoarthritis and degenerative changes notes. Additionally, there was a lucent lesion in the medial metaphysis of the tibia. Radiologist notes benign features, but mentions that he was unable to definitively characterize. Recommendations are are repeat imagine in 4-6 months to ensure stability. Imaging results reviewed with patient. Copies of films and dictated report provided for patient to take to his PCP for recommended follow up imaging. Regarding his acute pain,  will place patient in a compression wrap for stability and comfort. Recommended rest, ice, and elevation. Patient has been taking Aleve. Due to his CKD-III, he was encouraged to avoid this medication and use APAP instead. Will provide patient with a short term supply of Tramadol to use for severe pain over the next few days. If pain not improving, patient will need to be seen for further evaluation by orthopedics. Name and office contact information provided on today's AVS for Dr. Hessie Knows. Patient advised the he will need to contact the office to schedule an appointment to be seen.   Blood pressure elevated in clinic today. He denies any chest pain, shortness of breath, palpitations, headaches, visual changes, or unexplained weakness. He notes that it is normally well controlled. Patient forgot to take  antihypertensives today. Patient encouraged to make sure that he is taking his medications as prescribed and follow up with PCP.  Current clinical condition warrants patient being out of work in order to recover from his current injury/illness. He was provided with the appropriate documentation to provide to his place of employment that will allow for him to RTW on 01/29/2019 with no restrictions.   I have reviewed the follow up and strict return precautions for any new or worsening symptoms. Patient is aware of symptoms that would be deemed urgent/emergent, and would thus require further evaluation either here or in the emergency department. At the time of discharge, he verbalized understanding and consent with the discharge plan as it was reviewed with him. All questions were fielded by provider and/or clinic staff prior to patient discharge.    Final Clinical Impressions / Urgent Care Diagnoses:   Final diagnoses:  Acute left ankle pain  Osteoarthritis of ankle, unspecified laterality, unspecified osteoarthritis type  Musculoskeletal system imaging abnormality    New Prescriptions:  [place] Controlled Substance Registry consulted? Yes, I have consulted the [place] Controlled Substances Registry for this patient, and feel the risk/benefit ratio today is favorable for proceeding with this prescription for a controlled substance.  . Discussed use of controlled substance medication to treat his acute pain.  o Reviewed [place] STOP Act regulations  o Clinic does not refill controlled substances over the phone without face to face evaluation.  . Safety precautions reviewed.  o Medications should not be bitten, chewed, crushed, shared, or taken with alcohol.  o Avoid use while working, driving, or operating heavy machinery.  o Side effects associated with the use of this particular medication reviewed. - Patient understands that this medication can cause CNS depression, increase his risk of falls, and even lead  to overdose that may result in death, if used outside of the parameters that he and I discussed.  With all of this in mind, he knowingly accepts the risks and responsibilities associated with intended course of treatment, and elects to responsibly proceed as discussed.  Meds ordered this encounter  Medications  . traMADol (ULTRAM) 50 MG tablet    Sig: Take 1 tablet (50 mg total) by mouth every 12 (twelve) hours as needed.    Dispense:  8 tablet    Refill:  0    Recommended Follow up Care:  Patient encouraged to follow up with the following provider within the specified time frame, or sooner as dictated by the severity of his symptoms. As always, he was instructed that for any urgent/emergent care needs, he should seek care either here or in the emergency department for more immediate evaluation.  Follow-up Information  Hessie Knows, MD In 1 week.   Specialty: Orthopedic Surgery Why: General reassessment of symptoms if not improving Contact information: [address] [facility] [place] [address] [phone]         NOTE: This note was prepared using Dragon dictation software along with smaller phrase technology. Despite my best ability to proofread, there is the potential that transcriptional errors may still occur from this process, and are completely unintentional.     Karen Kitchens, NP 01/27/19 [phone]

## 2019-01-27 NOTE — Discharge Instructions (Addendum)
It was very nice seeing you today in clinic. Thank you for entrusting me with your care.   REST, ICE, and wear compression wrap for comfort. May use Tylenol as needed for pain. Will send in some stronger medication to use for pain not improved by Tylenol. MAKE SURE you are taking your blood pressure medications as directed by your doctor.   You have a spot on your bone that is likely benign. Radiologist is recommending repeat films in 4-6 months to ensure that it is stable. You will need to discuss this with you regular doctor. I will give you a copy of the report and the picture to share with them as well.   Make arrangements to follow up with your orthopedic doctor in 1 week for re-evaluation if not improving.  I have given you the name of an excellent local provider. If your symptoms/condition worsens, please seek follow up care either here or in the ER. Please remember, our [place] providers are "right here with you" when you need [place].   Again, it was my pleasure to take care of you today. Thank you for choosing our clinic. I hope that you start to feel better quickly.   Honor Loh, MSN, APRN, FNP-C, CEN Advanced Practice Provider [facility]

## 2019-01-27 NOTE — ED Triage Notes (Signed)
Patient in today c/o left ankle pain x 2 days. No injury noted. Patient states that he applied ice yesterday, but today he can't put any weight on his ankle.

## 2019-03-09 DIAGNOSIS — M1990 Unspecified osteoarthritis, unspecified site: Secondary | ICD-10-CM | POA: Insufficient documentation

## 2019-03-09 DIAGNOSIS — M659 Synovitis and tenosynovitis, unspecified: Secondary | ICD-10-CM | POA: Insufficient documentation

## 2019-04-14 ENCOUNTER — Encounter (HOSPITAL_COMMUNITY): Payer: Self-pay | Admitting: Emergency Medicine

## 2019-04-14 ENCOUNTER — Ambulatory Visit
Admission: EM | Admit: 2019-04-14 | Discharge: 2019-04-14 | Disposition: A | Discharge status: Home / Self Care | Payer: BC Managed Care – PPO | Attending: Emergency Medicine | Admitting: Emergency Medicine

## 2019-04-14 ENCOUNTER — Other Ambulatory Visit: Payer: Self-pay

## 2019-04-14 ENCOUNTER — Emergency Department (HOSPITAL_COMMUNITY)
Admission: EM | Admit: 2019-04-14 | Discharge: 2019-04-14 | Disposition: A | Discharge status: Home / Self Care | Payer: BC Managed Care – PPO | Attending: Emergency Medicine | Admitting: Emergency Medicine

## 2019-04-14 DIAGNOSIS — R519 Headache, unspecified: Secondary | ICD-10-CM | POA: Insufficient documentation

## 2019-04-14 DIAGNOSIS — R030 Elevated blood-pressure reading, without diagnosis of hypertension: Secondary | ICD-10-CM

## 2019-04-14 DIAGNOSIS — Z20822 Contact with and (suspected) exposure to covid-19: Secondary | ICD-10-CM

## 2019-04-14 DIAGNOSIS — R6883 Chills (without fever): Secondary | ICD-10-CM | POA: Diagnosis not present

## 2019-04-14 DIAGNOSIS — Z20828 Contact with and (suspected) exposure to other viral communicable diseases: Secondary | ICD-10-CM | POA: Diagnosis not present

## 2019-04-14 DIAGNOSIS — J069 Acute upper respiratory infection, unspecified: Secondary | ICD-10-CM

## 2019-04-14 DIAGNOSIS — R05 Cough: Secondary | ICD-10-CM | POA: Insufficient documentation

## 2019-04-14 DIAGNOSIS — Z5321 Procedure and treatment not carried out due to patient leaving prior to being seen by health care provider: Secondary | ICD-10-CM | POA: Insufficient documentation

## 2019-04-14 MED ORDER — BENZONATATE 100 MG PO CAPS: 100.0000 mg | ORAL_CAPSULE | Freq: Three times a day (TID) | ORAL | 0 refills | Status: DC

## 2019-04-14 MED ORDER — FLUTICASONE PROPIONATE 50 MCG/ACT NA SUSP: 2.0000 | Freq: Every day | NASAL | 0 refills | Status: DC

## 2019-04-14 MED ORDER — CETIRIZINE HCL 10 MG PO CHEW: 10.0000 mg | CHEWABLE_TABLET | Freq: Every day | ORAL | 0 refills | Status: DC

## 2019-04-14 NOTE — ED Notes (Signed)
PT told registration he was leaving at this time.

## 2019-04-14 NOTE — Discharge Instructions (Signed)
COVID testing ordered.  It will take between 5-7 days for test results.  Someone will contact you regarding abnormal results.    In the meantime: You should remain isolated in your home for 10 days from symptom onset AND greater than 72 hours after symptoms resolution (absence of fever without the use of fever-reducing medication and improvement in respiratory symptoms), whichever is longer Get plenty of rest and push fluids Tessalon Perles prescribed for cough Zyrtec prescribed for nasal congestion, runny nose, and/or sore throat Flonase prescribed for nasal congestion and runny nose Use medications daily for symptom relief Use OTC medications like ibuprofen or tylenol as needed fever or pain Call or go to the ED if you have any new or worsening symptoms such as fever, worsening cough, shortness of breath, chest tightness, chest pain, turning blue, changes in mental status, etc...   Blood pressure elevated in office.  Please recheck in 24 hours.  If it continues to be greater than 140/90 please follow up with PCP for further evaluation and management.

## 2019-04-14 NOTE — ED Triage Notes (Signed)
PT c/o headache, hot/cold chills, productive green sputum cough x2 days. PT denies any OTC medications today.

## 2019-04-14 NOTE — ED Provider Notes (Signed)
[place]   [id number] 04/14/19 Arrival Time: [id number]   CC: COVID symptoms; COVID test  SUBJECTIVE: History from: patient.  Dominic Henry is a 43 y.o. male who presents with headache, sore throat,and  slight productive cough with yellow sputum x 2 days.  Denies sick exposure to COVID, flu or strep.  Denies recent travel.  Has tried allergy medication with minimal relief.  Reports previous symptoms in the past.   Denies fever, chills, fatigue, SOB, wheezing, chest pain, nausea, vomiting, changes in bowel or bladder habits.    ROS: As per HPI.  All other pertinent ROS negative.     Past Medical History:  Diagnosis Date  . CKD (chronic kidney disease), stage III 12/16/2016  . Hypertension 05/21/2013  . Stroke [facility]) 05/21/2014   Past Surgical History:  Procedure Laterality Date  . NO PAST SURGERIES     No Known Allergies No current facility-administered medications on file prior to encounter.    Current Outpatient Medications on File Prior to Encounter  Medication Sig Dispense Refill  . amLODipine (NORVASC) 5 MG tablet Take 1 tablet (5 mg total) by mouth daily. For hypertension 30 tablet 1  . hydrALAZINE (APRESOLINE) 25 MG tablet Take 1 tablet by mouth daily.    Marland Kitchen losartan (COZAAR) 25 MG tablet Take 25 mg by mouth daily.    . metoprolol succinate (TOPROL-XL) 100 MG 24 hr tablet Take 1 tablet (100 mg total) by mouth daily. Take with or immediately following a meal. 30 tablet 1  . prochlorperazine (COMPAZINE) 10 MG tablet Take 1 tablet (10 mg total) by mouth every 6 (six) hours as needed for nausea (headache). 20 tablet 0  . traMADol (ULTRAM) 50 MG tablet Take 1 tablet (50 mg total) by mouth every 12 (twelve) hours as needed. 8 tablet 0   Social History   Socioeconomic History  . Marital status: Married    Spouse name: Not on file  . Number of children: Not on file  . Years of education: Not on file  . Highest education level: Not on file  Occupational History   . Not on file  Social Needs  . Financial resource strain: Not on file  . Food insecurity    Worry: Not on file    Inability: Not on file  . Transportation needs    Medical: Not on file    Non-medical: Not on file  Tobacco Use  . Smoking status: Never Smoker  . Smokeless tobacco: Never Used  Substance and Sexual Activity  . Alcohol use: No  . Drug use: No  . Sexual activity: Not on file  Lifestyle  . Physical activity    Days per week: Not on file    Minutes per session: Not on file  . Stress: Not on file  Relationships  . Social Herbalist on phone: Not on file    Gets together: Not on file    Attends religious service: Not on file    Active member of club or organization: Not on file    Attends meetings of clubs or organizations: Not on file    Relationship status: Not on file  . Intimate partner violence    Fear of current or ex partner: Not on file    Emotionally abused: Not on file    Physically abused: Not on file    Forced sexual activity: Not on file  Other Topics Concern  . Not on file  Social History Narrative  .  Not on file   Family History  Problem Relation Age of Onset  . Hypertension Mother   . Diabetes Mother   . Hypertension Father     OBJECTIVE:  Vitals:   04/14/19 1703  BP: (!) 163/107  Pulse: 85  Resp: 16  Temp: 98.9 F (37.2 C)  TempSrc: Oral  SpO2: 98%     General appearance: alert; appears mildly fatigued, but nontoxic; speaking in full sentences and tolerating own secretions HEENT: NCAT; Ears: EACs clear, TMs pearly gray; Eyes: PERRL.  EOM grossly intact. Nose: nares patent with mild rhinorrhea, turbinates mildly swollen; Throat: oropharynx clear, tonsils non erythematous or enlarged, uvula midline  Neck: supple without LAD Lungs: unlabored respirations, symmetrical air entry; cough: mild; no respiratory distress; CTAB Heart: regular rate and rhythm.  Skin: warm and dry Psychological: alert and cooperative; normal mood  and affect  ASSESSMENT & PLAN:  1. Suspected COVID-19 virus infection   2. Viral URI with cough   3. Elevated blood pressure reading     Meds ordered this encounter  Medications  . benzonatate (TESSALON) 100 MG capsule    Sig: Take 1 capsule (100 mg total) by mouth every 8 (eight) hours.    Dispense:  21 capsule    Refill:  0    Order Specific Question:   Supervising Provider    Answer:   Raylene Everts [id number]  . cetirizine (ZYRTEC) 10 MG chewable tablet    Sig: Chew 1 tablet (10 mg total) by mouth daily.    Dispense:  20 tablet    Refill:  0    Order Specific Question:   Supervising Provider    Answer:   Raylene Everts [id number]  . fluticasone (FLONASE) 50 MCG/ACT nasal spray    Sig: Place 2 sprays into both nostrils daily.    Dispense:  16 g    Refill:  0    Order Specific Question:   Supervising Provider    Answer:   Raylene Everts [id number]    COVID testing ordered.  It will take between 5-7 days for test results.  Someone will contact you regarding abnormal results.    In the meantime: You should remain isolated in your home for 10 days from symptom onset AND greater than 72 hours after symptoms resolution (absence of fever without the use of fever-reducing medication and improvement in respiratory symptoms), whichever is longer Get plenty of rest and push fluids Tessalon Perles prescribed for cough Zyrtec prescribed for nasal congestion, runny nose, and/or sore throat Flonase prescribed for nasal congestion and runny nose Use medications daily for symptom relief Use OTC medications like ibuprofen or tylenol as needed fever or pain Call or go to the ED if you have any new or worsening symptoms such as fever, worsening cough, shortness of breath, chest tightness, chest pain, turning blue, changes in mental status, etc...   Blood pressure elevated in office.  Please recheck in 24 hours.  If it continues to be greater than 140/90 please follow up with PCP  for further evaluation and management.    Reviewed expectations re: course of current medical issues. Questions answered. Outlined signs and symptoms indicating need for more acute intervention. Patient verbalized understanding. After Visit Summary given.         Lestine Box, PA-C 04/14/19 1734

## 2019-04-14 NOTE — ED Triage Notes (Signed)
Pt presents with c/o productive cough for past 2 days

## 2019-04-16 LAB — NOVEL CORONAVIRUS, NAA: SARS-CoV-2, NAA: NOT DETECTED

## 2019-05-20 ENCOUNTER — Other Ambulatory Visit: Payer: Self-pay

## 2019-05-20 ENCOUNTER — Ambulatory Visit: Payer: BC Managed Care – PPO | Attending: Internal Medicine

## 2019-05-20 DIAGNOSIS — Z20822 Contact with and (suspected) exposure to covid-19: Secondary | ICD-10-CM

## 2019-05-22 LAB — NOVEL CORONAVIRUS, NAA: SARS-CoV-2, NAA: NOT DETECTED

## 2019-07-18 IMAGING — DX DG CHEST 2V
2 series · 2 of 2 positions shown · non-contrast
Comparison: 12/13/2016

CLINICAL DATA: 42-year-old with a productive cough. Chest pain and
shortness of breath.

EXAM:
CHEST - 2 VIEW

[chest pa]
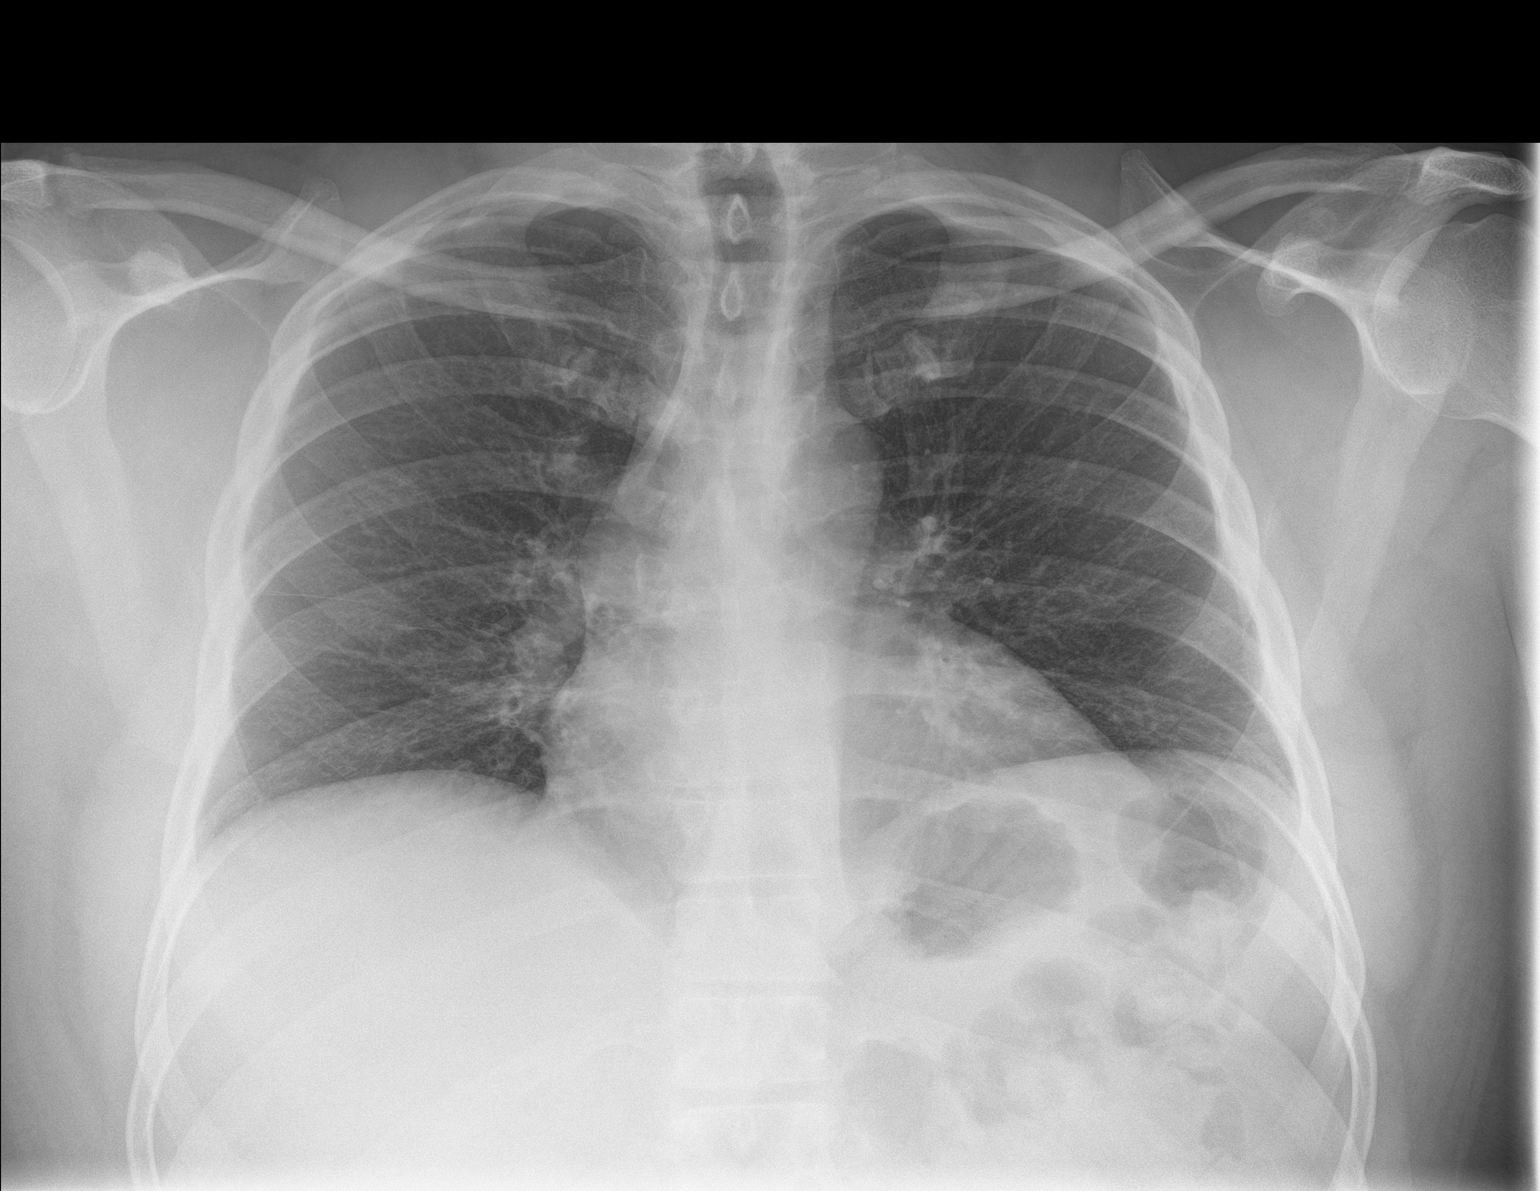

[chest lat]
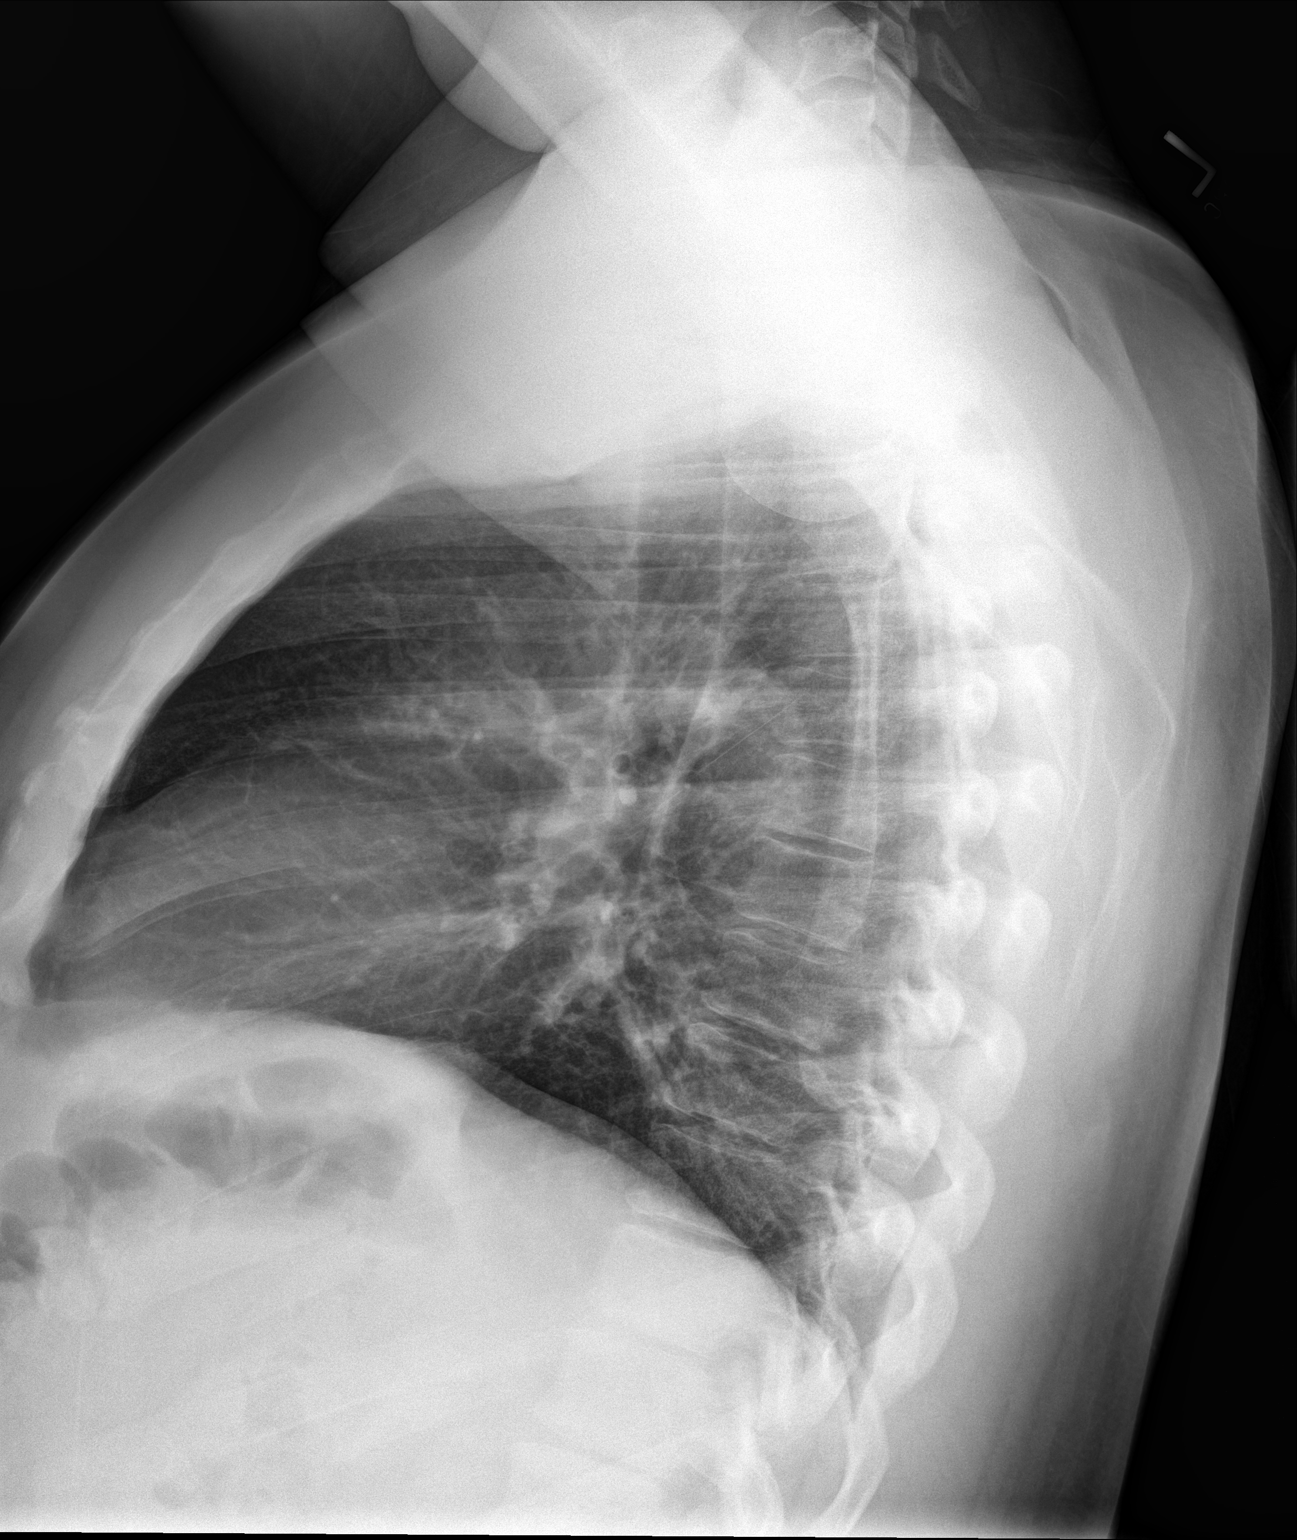

[2 of 2 positions shown; findings below may reference images not displayed]

FINDINGS: No focal airspace disease or pulmonary edema. Slightly low lung
volumes. Heart and mediastinum are within normal limits. No pleural
effusions. Trachea is midline. No acute bone abnormality.
IMPRESSION: No active cardiopulmonary disease.

## 2019-07-27 IMAGING — CR DG CHEST 2V
2 series · 2 of 2 positions shown · non-contrast
Comparison: 10/07/2017

CLINICAL DATA: Persistent cough for 10 days

EXAM:
CHEST - 2 VIEW

[chest pa]
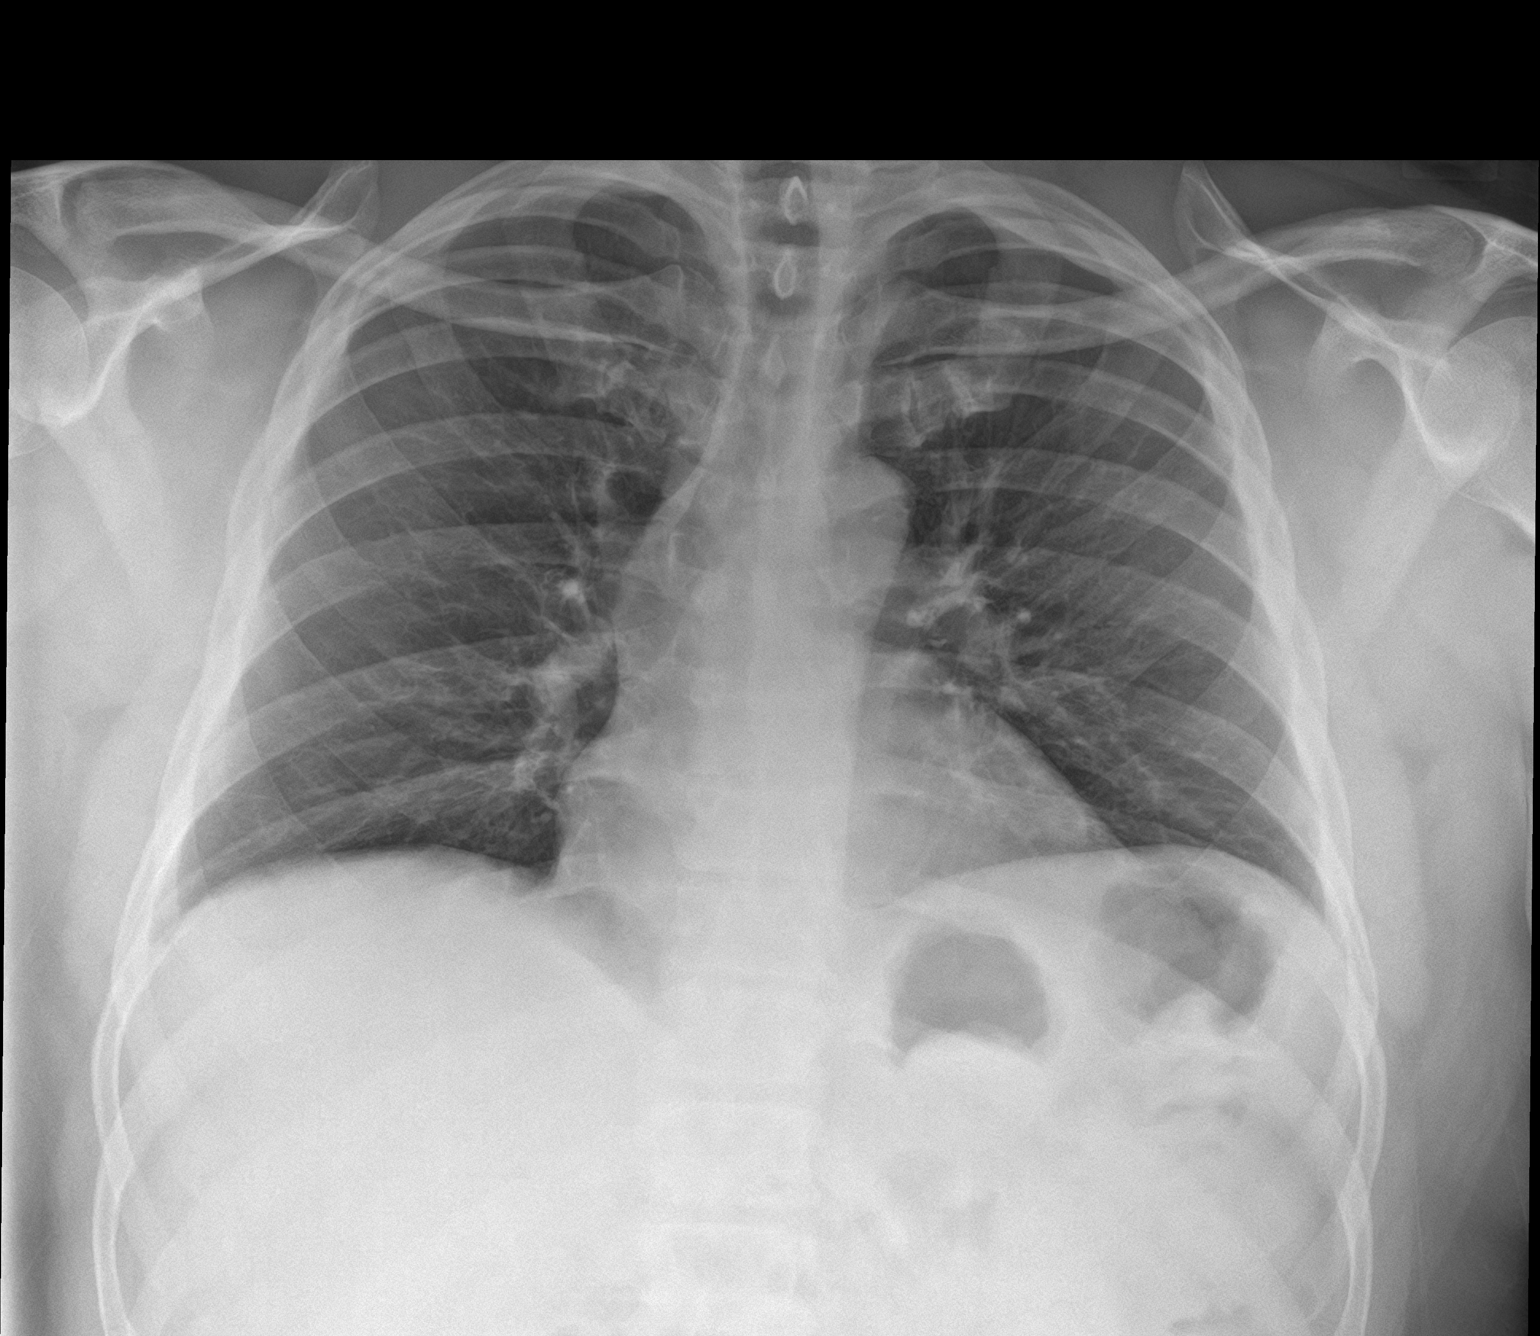

[chest lat]
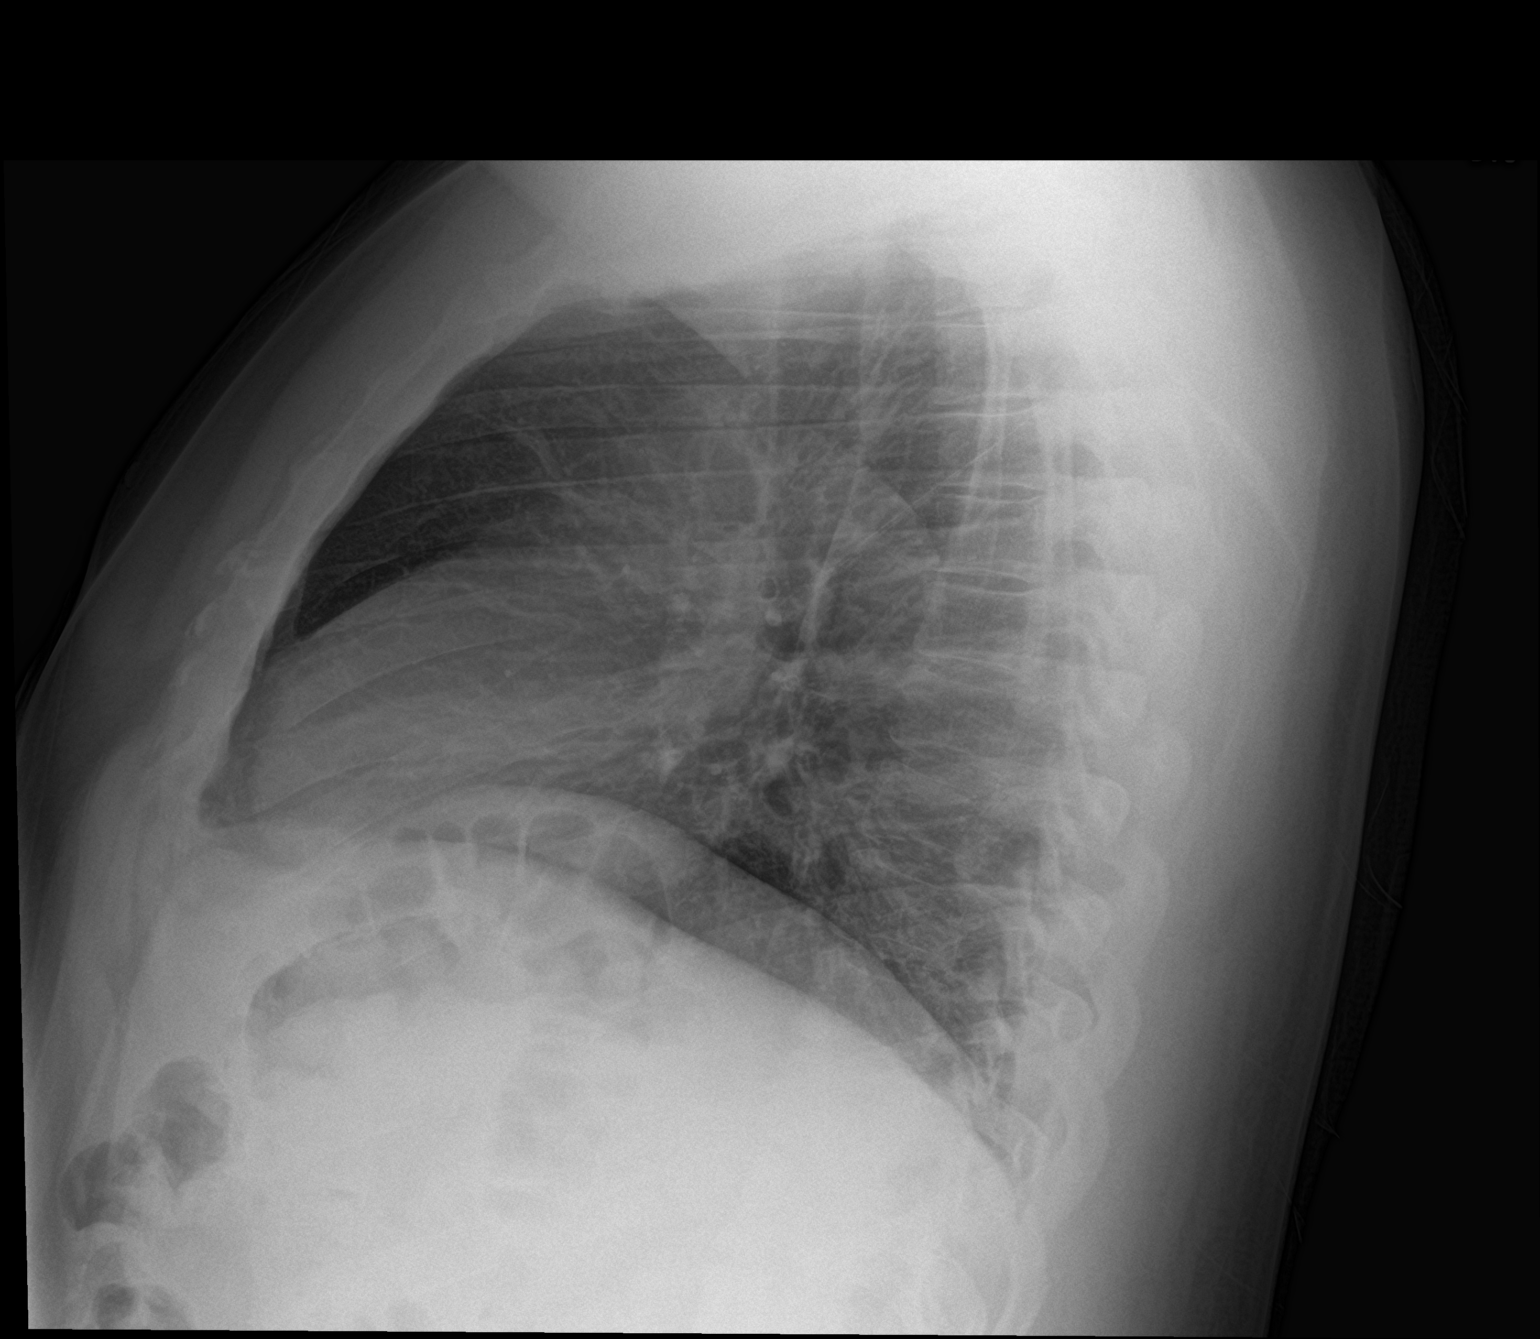

[2 of 2 positions shown; findings below may reference images not displayed]

FINDINGS: There is no edema, consolidation, effusion, or pneumothorax. Normal
heart size and mediastinal contours.
IMPRESSION: No evidence of active disease.

## 2019-10-13 ENCOUNTER — Other Ambulatory Visit: Payer: Self-pay | Admitting: Internal Medicine

## 2019-10-13 DIAGNOSIS — N051 Unspecified nephritic syndrome with focal and segmental glomerular lesions: Secondary | ICD-10-CM | POA: Insufficient documentation

## 2019-10-13 DIAGNOSIS — I129 Hypertensive chronic kidney disease with stage 1 through stage 4 chronic kidney disease, or unspecified chronic kidney disease: Secondary | ICD-10-CM | POA: Insufficient documentation

## 2019-10-13 DIAGNOSIS — E669 Obesity, unspecified: Secondary | ICD-10-CM | POA: Insufficient documentation

## 2019-10-13 DIAGNOSIS — E875 Hyperkalemia: Secondary | ICD-10-CM | POA: Insufficient documentation

## 2019-10-13 DIAGNOSIS — N2581 Secondary hyperparathyroidism of renal origin: Secondary | ICD-10-CM | POA: Insufficient documentation

## 2019-10-27 ENCOUNTER — Other Ambulatory Visit: Payer: Self-pay | Admitting: Nephrology

## 2019-10-27 DIAGNOSIS — N184 Chronic kidney disease, stage 4 (severe): Secondary | ICD-10-CM

## 2019-10-27 DIAGNOSIS — N179 Acute kidney failure, unspecified: Secondary | ICD-10-CM

## 2019-11-05 ENCOUNTER — Ambulatory Visit: Payer: Self-pay

## 2020-01-26 ENCOUNTER — Ambulatory Visit
Admission: RE | Admit: 2020-01-26 | Discharge: 2020-01-26 | Disposition: A | Discharge status: Home / Self Care | Payer: Self-pay | Source: Ambulatory Visit | Attending: Nephrology | Admitting: Nephrology

## 2020-01-26 ENCOUNTER — Other Ambulatory Visit: Payer: Self-pay

## 2020-01-26 DIAGNOSIS — N179 Acute kidney failure, unspecified: Secondary | ICD-10-CM | POA: Insufficient documentation

## 2020-01-26 DIAGNOSIS — N184 Chronic kidney disease, stage 4 (severe): Secondary | ICD-10-CM | POA: Insufficient documentation

## 2020-02-16 ENCOUNTER — Ambulatory Visit (INDEPENDENT_AMBULATORY_CARE_PROVIDER_SITE_OTHER): Payer: Self-pay | Admitting: Surgery

## 2020-02-16 ENCOUNTER — Other Ambulatory Visit: Payer: Self-pay

## 2020-02-16 ENCOUNTER — Encounter: Payer: Self-pay | Admitting: Surgery

## 2020-02-16 VITALS — BP 136/79 | HR 76 | Temp 98.5°F | Ht 70.0 in | Wt 250.0 lb

## 2020-02-16 DIAGNOSIS — N185 Chronic kidney disease, stage 5: Secondary | ICD-10-CM | POA: Insufficient documentation

## 2020-02-16 DIAGNOSIS — N2581 Secondary hyperparathyroidism of renal origin: Secondary | ICD-10-CM | POA: Insufficient documentation

## 2020-02-16 NOTE — Progress Notes (Signed)
Patient ID: Dominic Henry, male   DOB: 1975/12/07, 44 y.o.   MRN: [id number]  Chief Complaint: End-stage renal disease, referred for placement of peritoneal dialysis catheter.  History of Present Illness Dominic Henry is a 44 y.o. male with history of end-stage renal disease, currently making urine, cared for by [facility], Dr. Murlean Iba.  He is referred today for placement of peritoneal dialysis catheter. To his knowledge he has not met with [facility], is yet to have a thorough preoperative review of peritoneal dialysis. Regardless we spent a fair amount of time discussing progress of peritoneal dialysis why that is where we begin in assisting him with his Stage 5 chronic renal disease.  Prior kidney biopsy showed focal segmental glomerulosclerosis.   Past Medical History Past Medical History:  Diagnosis Date  . CKD (chronic kidney disease), stage III 12/16/2016  . Hypertension 05/21/2013  . Stroke ([place]) 05/21/2014      No past surgical history on file.  No Known Allergies  Current Outpatient Medications  Medication Sig Dispense Refill  . amLODipine (NORVASC) 5 MG tablet Take 1 tablet (5 mg total) by mouth daily. For hypertension 30 tablet 1  . Cholecalciferol 50 MCG (2000 UT) TABS Take 1 capsule by mouth daily.    Marland Kitchen losartan (COZAAR) 25 MG tablet Take 25 mg by mouth daily.    . sodium bicarbonate 650 MG tablet Take 2 tablets by mouth in the morning and at bedtime.     No current facility-administered medications for this visit.    Family History Family History  Problem Relation Age of Onset  . Hypertension Mother   . Diabetes Mother   . Hypertension Father       Social History Social History   Tobacco Use  . Smoking status: Never Smoker  . Smokeless tobacco: Never Used  Vaping Use  . Vaping Use: Never used  Substance Use Topics  . Alcohol use: No  . Drug use: No        Review of Systems  Constitutional: Positive for weight  loss.  HENT: Negative.   Eyes: Negative.   Respiratory: Negative.   Cardiovascular: Positive for leg swelling. Negative for chest pain.  Gastrointestinal: Negative.   Genitourinary: Positive for hematuria.  Skin: Negative.   Neurological: Negative.   Psychiatric/Behavioral: Negative.       Physical Exam Blood pressure 136/79, pulse 76, temperature 98.5 F (36.9 C), height $RemoveBe'5\' 10"'gqokVXasc$  (1.778 m), weight 250 lb (113.4 kg), SpO2 98 %. Last Weight  Most recent update: 02/16/2020  3:44 PM   Weight  113.4 kg (250 lb)            CONSTITUTIONAL: Well developed, and nourished, appropriately responsive and aware without distress.   EYES: Sclera non-icteric.   EARS, NOSE, MOUTH AND THROAT: Mask worn.    Hearing is intact to voice.  NECK: Trachea is midline, and there is no jugular venous distension.  LYMPH NODES:  Lymph nodes in the neck are not enlarged. RESPIRATORY:  Lungs are clear, and breath sounds are equal bilaterally. Normal respiratory effort without pathologic use of accessory muscles. CARDIOVASCULAR: Heart is regular in rate and rhythm. GI: The abdomen is soft, nontender, and nondistended. There were no palpable masses.  No surgical scars.   MUSCULOSKELETAL:  Symmetrical muscle tone appreciated in all four extremities.    SKIN: Skin turgor is normal. No pathologic skin lesions appreciated.  NEUROLOGIC:  Motor and sensation appear grossly normal.  Cranial nerves are grossly without  defect. PSYCH:  Alert and oriented to person, place and time. Affect is appropriate for situation.  Data Reviewed I have personally reviewed what is currently available of the patient's imaging, recent labs and medical records.   Labs: February 08, 2020 BUN 63, creatinine 7.2.  Potassium 5.1.  Phosphorus 3.5 Hemoglobin 10.5 platelets 318,000 intact parathyroid hormone 394.  Calcium 8.9 COVID-19 vaccine: Vaccinated  Imaging: Radiology review: Ultrasound of kidneys noted. Within last 24 hrs: No results  found.  Assessment    End-stage renal disease. Patient Active Problem List   Diagnosis Date Noted  . 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 Proteinuria 12/31/2017  . CKD (chronic kidney disease) stage 4, GFR 15-29 ml/min (HCC) 12/16/2016  . Vitamin B12 deficiency 12/15/2016  . Normocytic anemia 12/14/2016  . CAP (community acquired pneumonia) 12/13/2016  . AKI (acute kidney injury) ([place]) 12/13/2016  . Essential hypertension 12/13/2016    Plan    Laparoscopic placement of peritoneal dialysis catheter. Risks and benefits of above procedure been discussed in detail with the patient including but not limited to the following: Anesthesia, bleeding, infection, dysfunction or displacement of catheter.  Patient is well aware of its critical to maintain this catheter as long as feasible.  He is anticipating education from [place] and initiation of home peritoneal dialysis as planned.  Face-to-face time spent with the patient and accompanying care providers(if present) was 45 minutes, with more than 50% of the time spent counseling, educating, and coordinating care of the patient.      Ronny Bacon M.D., FACS 02/16/2020, 4:48 PM

## 2020-02-16 NOTE — Patient Instructions (Addendum)
Our surgery scheduler will be in contact with you to look at surgery dates and go over surgery information.   Peritoneal Dialysis Catheter Placement  Peritoneal dialysis catheter placement is a surgery to insert a thin, flexible tube (catheter) into the abdomen. The catheter will be used for peritoneal dialysis, which is a process for filtering the blood. The catheter will be small, soft, and easy to conceal. The catheter placement is usually done at least 2 weeks before peritoneal dialysis is started. During dialysis, wastes, salt, and extra water are removed from the blood. In peritoneal dialysis, these tasks are performed by transferring a fluid (dialysate) to and from the abdomen during each session. The fluid goes through the catheter to enter the abdomen at the start of each dialysis session, and it drains out of the body through the catheter at the end of each session. This procedure will be done using one of the following techniques:  Open technique. This is when the surgery is performed through one incision.  Laparoscopic technique. This is when smaller incisions are made and a tube with a light and camera on the end (laparoscope) is inserted through one of the incisions to help perform the surgery. The camera sends images to a video screen in the operating room. This lets the surgeon see inside the abdomen during the procedure. Tell a health care provider about:  Any allergies you have.  All medicines you are taking, including vitamins, herbs, eye drops, creams, and over-the-counter medicines.  Any problems you or family members have had with anesthetic medicines.  Any blood disorders you have.  Any surgeries you have had.  Any history of smoking.  Any medical conditions you have.  Whether you are pregnant or may be pregnant. What are the risks? Generally, this is a safe procedure. However, problems may occur, including:  Infection.  Too much bleeding.  A collection of  blood near the incision (hematoma).  Damage to blood vessels, tissues, or organs in the abdomen area.  Allergic reactions to medicines.  Pain or cramping.  Slow healing.  Catheter problems after the surgery, such as: ? The catheter becoming blocked. ? The catheter moving out of place. ? The catheter poking into or wrapping around intestines. ? Fluid leaking around the catheter.  Scarring.  Skin damage. What happens before the procedure? Medicines Ask your health care provider about:  Changing or stopping your regular medicines. This is especially important if you take diabetes medicines or blood thinners.  Taking medicines such as aspirin and ibuprofen. These medicines can thin your blood. Do not take these medicines before your procedure if your health care provider tells you not to. Staying hydrated Follow instructions from your health care provider about hydration, which may include:  Up to 2 hours before the procedure - you may continue to drink clear liquids, such as water, clear fruit juice, black coffee, and plain tea. Eating and drinking restrictions  Follow instructions from your health care provider about eating and drinking General instructions  Ask your health care provider how your catheter site will be marked or identified. Your health care provider will discuss the best site for the catheter to be placed. The site will be chosen to help prevent the catheter from being flattened or damaged, and to make it as comfortable as possible for you.  You may be asked to shower with a germ-killing soap.  You may have a CT scan of your abdomen.  You may have a blood sample taken.  Plan to have someone take you home from the hospital or clinic. What happens during the procedure?  To lower your risk of infection: ? Your health care team will wash or sanitize their hands. ? Your skin will be washed with soap. ? Hair may be removed from the surgical area.  An IV will  be inserted into one of your veins.  You may be given antibiotic medicine through your IV.  You will be given a medicine to make you fall asleep (general anesthetic).  If you are having an open surgery, one larger incision will be made in the abdomen.  If you are having laparoscopic surgery, a laparoscope and instruments will be put through small incisions in the abdomen.  The catheter will be put in place.  A short tunnel will be made under the skin to a location where the catheter exits the abdomen.  Stitches (sutures) will be placed around the catheter to hold it in place.  Your incisions will be closed with sutures or staples. The procedure may vary among health care providers and hospitals. What happens after the procedure?  Your blood pressure, heart rate, breathing rate, and blood oxygen level will be monitored until the medicines you were given have worn off.  You may have some pain. You will be given pain medicine as needed.  You will be given instructions about how to care for your catheter and how it is used for the dialysis process. Summary  Peritoneal dialysis catheter placement is a surgery to insert a thin, flexible tube (catheter) in your abdomen. This surgery must be done before you begin peritoneal dialysis.  Before the procedure, your health care provider will discuss the best site for the catheter to be placed. The site will be chosen to help prevent the catheter from being flattened or damaged, and to make it as comfortable as possible for you.  After the procedure, you will be given instructions about how to care for your catheter and how it is used for the dialysis process. This information is not intended to replace advice given to you by your health care provider. Make sure you discuss any questions you have with your health care provider. Document Revised: 08/28/2018 Document Reviewed: 06/15/2016 Elsevier Patient Education  [place].

## 2020-02-18 ENCOUNTER — Telehealth: Payer: Self-pay | Admitting: Surgery

## 2020-02-18 NOTE — Telephone Encounter (Signed)
Per patient's wife, they are undecided about proceeding with PD cath placement.  They are awaiting Dr. Keturah Barre office to call them back before proceeding.     Patient has been advised of Pre-Admission date/time, COVID Testing date and Surgery date.  Surgery Date: 03/02/20 Preadmission Testing Date: 02/24/20 (phone 8a-1p) Covid Testing Date: 02/29/20 - patient advised to go to the [facility] ([place]) between 8a-1p  Patient has been made aware to call [phone], between 1-3:00pm the day before surgery, to find out what time to arrive for surgery.

## 2020-02-22 ENCOUNTER — Other Ambulatory Visit: Payer: Self-pay

## 2020-02-22 ENCOUNTER — Telehealth: Payer: Self-pay | Admitting: Surgery

## 2020-02-22 ENCOUNTER — Encounter (INDEPENDENT_AMBULATORY_CARE_PROVIDER_SITE_OTHER): Payer: Self-pay | Admitting: Nurse Practitioner

## 2020-02-22 ENCOUNTER — Other Ambulatory Visit
Admission: RE | Admit: 2020-02-22 | Discharge: 2020-02-22 | Disposition: A | Discharge status: Home / Self Care | Payer: Medicaid Other | Source: Ambulatory Visit | Attending: Vascular Surgery | Admitting: Vascular Surgery

## 2020-02-22 ENCOUNTER — Telehealth (INDEPENDENT_AMBULATORY_CARE_PROVIDER_SITE_OTHER): Payer: Self-pay

## 2020-02-22 DIAGNOSIS — Z20822 Contact with and (suspected) exposure to covid-19: Secondary | ICD-10-CM | POA: Diagnosis not present

## 2020-02-22 DIAGNOSIS — Z01812 Encounter for preprocedural laboratory examination: Secondary | ICD-10-CM | POA: Diagnosis not present

## 2020-02-22 LAB — SARS CORONAVIRUS 2 (TAT 6-24 HRS): SARS Coronavirus 2: NEGATIVE

## 2020-02-22 NOTE — Telephone Encounter (Signed)
Spoke with the patient's wife and he is scheduled with Dr. Delana Meyer for a permcath insertion on 02/24/20 with a 1:30 pm arrival to the [facility]. Covid testing on 02/22/20 between 8-1 pm at the [facility]. Pre-procedure instructions were discussed. Patient's wife stated she was writing the information down.

## 2020-02-22 NOTE — Telephone Encounter (Signed)
Per patient and his wife they want to cancel the surgery for PD placement scheduled for 03/02/20.  They will be going another route with a catheter.  At their request surgery with [place] is cancelled.  Telephone call to pre-admissions also to cancel pre-admit date and covid testing date as well.

## 2020-02-23 ENCOUNTER — Other Ambulatory Visit (INDEPENDENT_AMBULATORY_CARE_PROVIDER_SITE_OTHER): Payer: Self-pay | Admitting: Nurse Practitioner

## 2020-02-24 ENCOUNTER — Ambulatory Visit: Admission: RE | Admit: 2020-02-24 | Payer: Medicaid Other | Source: Home / Self Care | Admitting: Vascular Surgery

## 2020-02-24 ENCOUNTER — Encounter: Admission: RE | Payer: Self-pay | Source: Home / Self Care

## 2020-02-24 ENCOUNTER — Inpatient Hospital Stay: Admission: RE | Admit: 2020-02-24 | Payer: Medicaid Other | Source: Ambulatory Visit

## 2020-02-24 SURGERY — DIALYSIS/PERMA CATHETER INSERTION
Anesthesia: Moderate Sedation

## 2020-02-24 NOTE — Telephone Encounter (Signed)
Patient called wanting to know why his procedure had been canceled. I was told yesterday to cancel the patient's permcath insertion. I called [facility] and it was explained that she spoke with someone in the office to cancel the referral/procedure but had not informed the patient. Patient has been informed.

## 2020-02-29 ENCOUNTER — Other Ambulatory Visit (INDEPENDENT_AMBULATORY_CARE_PROVIDER_SITE_OTHER): Payer: Self-pay | Admitting: Vascular Surgery

## 2020-02-29 ENCOUNTER — Other Ambulatory Visit: Payer: Medicaid Other

## 2020-02-29 DIAGNOSIS — N186 End stage renal disease: Secondary | ICD-10-CM

## 2020-03-01 ENCOUNTER — Ambulatory Visit (INDEPENDENT_AMBULATORY_CARE_PROVIDER_SITE_OTHER): Payer: Self-pay

## 2020-03-01 ENCOUNTER — Other Ambulatory Visit: Payer: Self-pay

## 2020-03-01 ENCOUNTER — Ambulatory Visit (INDEPENDENT_AMBULATORY_CARE_PROVIDER_SITE_OTHER): Payer: Self-pay | Admitting: Vascular Surgery

## 2020-03-01 ENCOUNTER — Encounter (INDEPENDENT_AMBULATORY_CARE_PROVIDER_SITE_OTHER): Payer: Self-pay | Admitting: Vascular Surgery

## 2020-03-01 VITALS — BP 156/90 | HR 73 | Ht 69.0 in | Wt 251.0 lb

## 2020-03-01 DIAGNOSIS — N186 End stage renal disease: Secondary | ICD-10-CM

## 2020-03-01 DIAGNOSIS — N185 Chronic kidney disease, stage 5: Secondary | ICD-10-CM

## 2020-03-01 DIAGNOSIS — I10 Essential (primary) hypertension: Secondary | ICD-10-CM

## 2020-03-01 NOTE — H&P (View-Only) (Signed)
Patient ID: Dominic Henry, male   DOB: 1976/04/03, 44 y.o.   MRN: [id number]  Chief Complaint  Patient presents with  . New Patient (Initial Visit)    Dominic Henry. Vein mapping    HPI Dominic Henry is a 44 y.o. male.  I am asked to see the patient by Dr. Candiss Henry for evaluation of vein mapping. He is not yet on dialysis but he is now stage 4/5 and nearing dialysis dependence.  He is right-hand dominant.  He has mild swelling and some mild shortness of breath with activity but no immediate needs for dialysis.  He had originally planned to do peritoneal dialysis, but that has now changed and he is going to be doing hemodialysis.  Noninvasive studies were done today.  His arterial studies were normal bilaterally.  His venous study showed good cephalic veins bilaterally at the antecubital fossa although they were marginal to a little bit too small in the forearm.     Past Medical History:  Diagnosis Date  . CKD (chronic kidney disease) stage 4, GFR 15-29 ml/min (HCC) 12/16/2016  . CKD (chronic kidney disease), stage III ([place]) 12/16/2016  . Hypertension 05/21/2013  . Stroke ([place]) 05/21/2014    No past surgical history on file.   Family History  Problem Relation Age of Onset  . Hypertension Mother   . Diabetes Mother   . Hypertension Father   no clotting disorders, no bleeding disorders, no aneurysms   Social History   Tobacco Use  . Smoking status: Never Smoker  . Smokeless tobacco: Never Used  Vaping Use  . Vaping Use: Never used  Substance Use Topics  . Alcohol use: No  . Drug use: No    No Known Allergies  Current Outpatient Medications  Medication Sig Dispense Refill  . amLODipine (NORVASC) 10 MG tablet Take 10 mg by mouth daily.    . Cholecalciferol 50 MCG (2000 UT) TABS Take 2,000 Units by mouth daily.     . sodium bicarbonate 650 MG tablet Take 1,300 mg by mouth in the morning and at bedtime.     Marland Kitchen amLODipine (NORVASC) 5 MG tablet Take 1 tablet (5 mg  total) by mouth daily. For hypertension (Patient not taking: Reported on 02/18/2020) 30 tablet 1  . losartan (COZAAR) 25 MG tablet Take 25 mg by mouth daily.     No current facility-administered medications for this visit.      REVIEW OF SYSTEMS (Negative unless checked)  Constitutional: [] Weight loss  [] Fever  [] Chills Cardiac: [] Chest pain   [] Chest pressure   [] Palpitations   [] Shortness of breath when laying flat   [] Shortness of breath at rest   [] Shortness of breath with exertion. Vascular:  [] Pain in legs with walking   [] Pain in legs at rest   [] Pain in legs when laying flat   [] Claudication   [] Pain in feet when walking  [] Pain in feet at rest  [] Pain in feet when laying flat   [] History of DVT   [] Phlebitis   [] Swelling in legs   [] Varicose veins   [] Non-healing ulcers Pulmonary:   [] Uses home oxygen   [] Productive cough   [] Hemoptysis   [] Wheeze  [] COPD   [] Asthma Neurologic:  [] Dizziness  [] Blackouts   [] Seizures   [x] History of stroke   [] History of TIA  [] Aphasia   [] Temporary blindness   [] Dysphagia   [] Weakness or numbness in arms   [] Weakness or numbness in legs Musculoskeletal:  [x] Arthritis   [] Joint  swelling   [x] Joint pain   [] Low back pain Hematologic:  [] Easy bruising  [] Easy bleeding   [] Hypercoagulable state   [x] Anemic  [] Hepatitis Gastrointestinal:  [] Blood in stool   [] Vomiting blood  [] Gastroesophageal reflux/heartburn   [] Abdominal pain Genitourinary:  [x] Chronic kidney disease   [] Difficult urination  [] Frequent urination  [] Burning with urination   [] Hematuria Skin:  [] Rashes   [] Ulcers   [] Wounds Psychological:  [] History of anxiety   []  History of major depression.    Physical Exam BP (!) 156/90   Pulse 73   Ht 5\' 9"  (1.753 m)   Wt 251 lb (113.9 kg)   BMI 37.07 kg/m  Gen:  WD/WN, NAD Head: [place]/AT, No temporalis wasting.  Ear/Nose/Throat: Hearing grossly intact, nares w/o erythema or drainage, oropharynx w/o Erythema/Exudate Eyes: Conjunctiva clear,  sclera non-icteric  Neck: trachea midline.  No JVD.  Pulmonary:  Good air movement, respirations not labored, no use of accessory muscles  Cardiac: RRR, no JVD Vascular: Allen's test normal bilaterally Vessel Right Left  Radial Palpable Palpable                                    Musculoskeletal: M/S 5/5 throughout.  Extremities without ischemic changes.  No deformity or atrophy. Mild LE edema. Neurologic: Sensation grossly intact in extremities.  Symmetrical.  Speech is fluent. Motor exam as listed above. Psychiatric: Judgment intact, Mood & affect appropriate for pt's clinical situation. Dermatologic: No rashes or ulcers noted.  No cellulitis or open wounds.    Radiology No results found.  Labs Recent Results (from the past 2160 hour(s))  SARS CORONAVIRUS 2 (TAT 6-24 HRS) Nasopharyngeal Nasopharyngeal Swab     Status: None   Collection Time: 02/22/20 11:37 AM   Specimen: Nasopharyngeal Swab  Result Value Ref Range   SARS Coronavirus 2 NEGATIVE NEGATIVE    Comment: (NOTE) SARS-CoV-2 target nucleic acids are NOT DETECTED.  The SARS-CoV-2 RNA is generally detectable in upper and lower respiratory specimens during the acute phase of infection. Negative results do not preclude SARS-CoV-2 infection, do not rule out co-infections with other pathogens, and should not be used as the sole basis for treatment or other patient management decisions. Negative results must be combined with clinical observations, patient history, and epidemiological information. The expected result is Negative.  Fact Sheet for Patients: SugarRoll.be  Fact Sheet for Healthcare Providers: https://www.woods-mathews.com/  This test is not yet approved or cleared by the [place] FDA and  has been authorized for detection and/or diagnosis of SARS-CoV-2 by FDA under an Emergency Use Authorization (EUA). This EUA will remain  in effect (meaning this test  can be used) for the duration of the COVID-19 declaration under Se ction 564(b)(1) of the Act, 21 U.S.C. section 360bbb-3(b)(1), unless the authorization is terminated or revoked sooner.  Performed at [facility], [address] [place], [place] [address]     Assessment/Plan:  Chronic kidney disease, stage V ([place]) Noninvasive studies were done today.  His arterial studies were normal bilaterally.  His venous study showed good cephalic veins bilaterally at the antecubital fossa although they were marginal to a little bit too small in the forearm.   Recommend:  At this time the patient does not have appropriate extremity access for dialysis  Patient should have a left brachiocephalic created.  The risks, benefits and alternative therapies were reviewed in detail with the patient.  All questions  were answered.  The patient agrees to proceed with surgery.    HTN (hypertension) An underlying cause of his renal failure and blood pressure control important in reducing the progression of atherosclerotic disease. On appropriate oral medications.       Leotis Pain 03/01/2020, 9:41 AM   This note was created with Dragon medical transcription system.  Any errors from dictation are unintentional.

## 2020-03-01 NOTE — Patient Instructions (Signed)
AV Fistula Placement  Arteriovenous (AV) fistula placement is a surgical procedure to create a connection between a blood vessel that carries blood away from your heart (artery) and a blood vessel that returns blood to your heart (vein). This connection is called a fistula. It is often made in the forearm or upper arm. You may need this procedure if you are getting hemodialysis treatments for kidney disease. An AV fistula makes your vein larger and stronger over several months. This makes the vein a safe and easy spot to insert the needles that are used for hemodialysis. Tell a health care provider about:  Any allergies you have.  All medicines you are taking, including vitamins, herbs, eye drops, creams, and over-the-counter medicines.  Any problems you or family members have had with anesthetic medicines.  Any blood disorders you have.  Any surgeries you have had.  Any medical conditions you have or have had in the past. What are the risks? Generally, this is a safe procedure. However, problems may occur, including:  Infection.  Blood clot (thrombosis).  Reduced blood flow (stenosis).  Weakening or ballooning out of the fistula (aneurysm).  Bleeding.  Allergic reactions to medicines.  Nerve damage.  Swelling near the fistula (lymphedema).  Weakening of your heart (congestive heart failure).  Failure of the procedure. What happens before the procedure? Staying hydrated Follow instructions from your health care provider about hydration, which may include:  Up to 2 hours before the procedure - you may continue to drink clear liquids, such as water, clear fruit juice, black coffee, and plain tea.  Eating and drinking restrictions Follow instructions from your health care provider about eating and drinking, which may include:  8 hours before the procedure - stop eating heavy meals or foods, such as meat, fried foods, or fatty foods.  6 hours before the procedure - stop  eating light meals or foods, such as toast or cereal.  6 hours before the procedure - stop drinking milk or drinks that contain milk.  2 hours before the procedure - stop drinking clear liquids. Medicines Ask your health care provider about:  Changing or stopping your regular medicines. This is especially important if you are taking diabetes medicines or blood thinners.  Taking medicines such as aspirin and ibuprofen. These medicines can thin your blood. Do not take these medicines unless your health care provider tells you to take them.  Taking over-the-counter medicines, vitamins, herbs, and supplements. General instructions  Imaging tests of your arm may be done to find the best place for the fistula.  Plan to have someone take you home from the hospital or clinic.  Ask your health care provider how your surgical site will be marked or identified.  Ask your health care provider what steps will be taken to help prevent infection. These may include: ? Removing hair at the surgery site. ? Washing skin with a germ-killing soap. ? Taking antibiotic medicine.  Do not use any products that contain nicotine or tobacco for as long as possible before and after the procedure. These products include cigarettes, e-cigarettes, and chewing tobacco. If you need help quitting, ask your health care provider. What happens during the procedure?  An IV will be inserted into one of your veins.  You will be given one or more of the following: ? A medicine to help you relax (sedative). ? A medicine to numb the area (local anesthetic). ? A medicine to make you fall asleep (general anesthetic). ? A medicine that  is injected into an area of your body to numb everything below the injection site (regional anesthetic).  The fistula site will be cleaned with a germ-killing solution (antiseptic).  An incision will be made on the inner side of your arm.  A vein and an artery will be opened and connected  with stitches (sutures).  The incision will be closed with sutures or clips.  A bandage (dressing) will be placed over the area. The procedure may vary among health care providers and hospitals. What happens after the procedure?  Your blood pressure, heart rate, breathing rate, and blood oxygen level may be monitored until you leave the hospital or clinic.  Your fistula site will be checked for bleeding or swelling.  You will be given pain medicine as needed.  Do not drive for 24 hours if you were given a sedative during your procedure. Summary  Arteriovenous (AV) fistula placement is a surgical procedure to create a connection between a blood vessel that carries blood away from your heart (artery) and a blood vessel that returns blood to your heart (vein). This connection is called a fistula.  Follow instructions from your health care provider about eating and drinking before the procedure.  Ask your health care provider about changing or stopping your regular medicines before the procedure. This is especially important if you are taking diabetes medicines or blood thinners.  Do not drive for 24 hours if you were given a sedative during your procedure.  Plan to have someone take you home from the hospital or clinic. This information is not intended to replace advice given to you by your health care provider. Make sure you discuss any questions you have with your health care provider. Document Revised: 10/24/2018 Document Reviewed: 11/11/2017 Elsevier Patient Education  [place].

## 2020-03-01 NOTE — Assessment & Plan Note (Signed)
An underlying cause of his renal failure and blood pressure control important in reducing the progression of atherosclerotic disease. On appropriate oral medications.

## 2020-03-01 NOTE — Assessment & Plan Note (Signed)
Noninvasive studies were done today.  His arterial studies were normal bilaterally.  His venous study showed good cephalic veins bilaterally at the antecubital fossa although they were marginal to a little bit too small in the forearm.   Recommend:  At this time the patient does not have appropriate extremity access for dialysis  Patient should have a left brachiocephalic created.  The risks, benefits and alternative therapies were reviewed in detail with the patient.  All questions were answered.  The patient agrees to proceed with surgery.

## 2020-03-01 NOTE — Progress Notes (Signed)
Patient ID: Dominic Henry, male   DOB: 18-Mar-1976, 44 y.o.   MRN: [id number]  Chief Complaint  Patient presents with  . New Patient (Initial Visit)    stat. Singh. Vein mapping    HPI Dominic Henry is a 44 y.o. male.  I am asked to see the patient by Dr. Candiss Norse for evaluation of vein mapping. He is not yet on dialysis but he is now stage 4/5 and nearing dialysis dependence.  He is right-hand dominant.  He has mild swelling and some mild shortness of breath with activity but no immediate needs for dialysis.  He had originally planned to do peritoneal dialysis, but that has now changed and he is going to be doing hemodialysis.  Noninvasive studies were done today.  His arterial studies were normal bilaterally.  His venous study showed good cephalic veins bilaterally at the antecubital fossa although they were marginal to a little bit too small in the forearm.     Past Medical History:  Diagnosis Date  . CKD (chronic kidney disease) stage 4, GFR 15-29 ml/min ([facility]) 12/16/2016  . CKD (chronic kidney disease), stage III ([facility]) 12/16/2016  . Hypertension 05/21/2013  . Stroke ([facility]) 05/21/2014    No past surgical history on file.   Family History  Problem Relation Age of Onset  . Hypertension Mother   . Diabetes Mother   . Hypertension Father   no clotting disorders, no bleeding disorders, no aneurysms   Social History   Tobacco Use  . Smoking status: Never Smoker  . Smokeless tobacco: Never Used  Vaping Use  . Vaping Use: Never used  Substance Use Topics  . Alcohol use: No  . Drug use: No    No Known Allergies  Current Outpatient Medications  Medication Sig Dispense Refill  . amLODipine (NORVASC) 10 MG tablet Take 10 mg by mouth daily.    . Cholecalciferol 50 MCG (2000 UT) TABS Take 2,000 Units by mouth daily.     . sodium bicarbonate 650 MG tablet Take 1,300 mg by mouth in the morning and at bedtime.     Marland Kitchen amLODipine (NORVASC) 5 MG tablet Take 1 tablet (5 mg  total) by mouth daily. For hypertension (Patient not taking: Reported on 02/18/2020) 30 tablet 1  . losartan (COZAAR) 25 MG tablet Take 25 mg by mouth daily.     No current facility-administered medications for this visit.      REVIEW OF SYSTEMS (Negative unless checked)  Constitutional: [] Weight loss  [] Fever  [] Chills Cardiac: [] Chest pain   [] Chest pressure   [] Palpitations   [] Shortness of breath when laying flat   [] Shortness of breath at rest   [] Shortness of breath with exertion. Vascular:  [] Pain in legs with walking   [] Pain in legs at rest   [] Pain in legs when laying flat   [] Claudication   [] Pain in feet when walking  [] Pain in feet at rest  [] Pain in feet when laying flat   [] History of DVT   [] Phlebitis   [] Swelling in legs   [] Varicose veins   [] Non-healing ulcers Pulmonary:   [] Uses home oxygen   [] Productive cough   [] Hemoptysis   [] Wheeze  [] COPD   [] Asthma Neurologic:  [] Dizziness  [] Blackouts   [] Seizures   [x] History of stroke   [] History of TIA  [] Aphasia   [] Temporary blindness   [] Dysphagia   [] Weakness or numbness in arms   [] Weakness or numbness in legs Musculoskeletal:  [x] Arthritis   [] Joint  swelling   [x] Joint pain   [] Low back pain Hematologic:  [] Easy bruising  [] Easy bleeding   [] Hypercoagulable state   [x] Anemic  [] Hepatitis Gastrointestinal:  [] Blood in stool   [] Vomiting blood  [] Gastroesophageal reflux/heartburn   [] Abdominal pain Genitourinary:  [x] Chronic kidney disease   [] Difficult urination  [] Frequent urination  [] Burning with urination   [] Hematuria Skin:  [] Rashes   [] Ulcers   [] Wounds Psychological:  [] History of anxiety   []  History of major depression.    Physical Exam BP (!) 156/90   Pulse 73   Ht 5\' 9"  (1.753 m)   Wt 251 lb (113.9 kg)   BMI 37.07 kg/m  Gen:  WD/WN, NAD Head: [place]/AT, No temporalis wasting.  Ear/Nose/Throat: Hearing grossly intact, nares w/o erythema or drainage, oropharynx w/o Erythema/Exudate Eyes: Conjunctiva clear,  sclera non-icteric  Neck: trachea midline.  No JVD.  Pulmonary:  Good air movement, respirations not labored, no use of accessory muscles  Cardiac: RRR, no JVD Vascular: Allen's test normal bilaterally Vessel Right Left  Radial Palpable Palpable                                    Musculoskeletal: M/S 5/5 throughout.  Extremities without ischemic changes.  No deformity or atrophy. Mild LE edema. Neurologic: Sensation grossly intact in extremities.  Symmetrical.  Speech is fluent. Motor exam as listed above. Psychiatric: Judgment intact, Mood & affect appropriate for pt's clinical situation. Dermatologic: No rashes or ulcers noted.  No cellulitis or open wounds.    Radiology No results found.  Labs Recent Results (from the past 2160 hour(s))  SARS CORONAVIRUS 2 (TAT 6-24 HRS) Nasopharyngeal Nasopharyngeal Swab     Status: None   Collection Time: 02/22/20 11:37 AM   Specimen: Nasopharyngeal Swab  Result Value Ref Range   SARS Coronavirus 2 NEGATIVE NEGATIVE    Comment: (NOTE) SARS-CoV-2 target nucleic acids are NOT DETECTED.  The SARS-CoV-2 RNA is generally detectable in upper and lower respiratory specimens during the acute phase of infection. Negative results do not preclude SARS-CoV-2 infection, do not rule out co-infections with other pathogens, and should not be used as the sole basis for treatment or other patient management decisions. Negative results must be combined with clinical observations, patient history, and epidemiological information. The expected result is Negative.  Fact Sheet for Patients: SugarRoll.be  Fact Sheet for Healthcare Providers: https://www.woods-mathews.com/  This test is not yet approved or cleared by the [place] FDA and  has been authorized for detection and/or diagnosis of SARS-CoV-2 by FDA under an Emergency Use Authorization (EUA). This EUA will remain  in effect (meaning this test  can be used) for the duration of the COVID-19 declaration under Se ction 564(b)(1) of the Act, 21 U.S.C. section 360bbb-3(b)(1), unless the authorization is terminated or revoked sooner.  Performed at [facility], [place] [address] [place], [place] [address]     Assessment/Plan:  Chronic kidney disease, stage V ([facility]) Noninvasive studies were done today.  His arterial studies were normal bilaterally.  His venous study showed good cephalic veins bilaterally at the antecubital fossa although they were marginal to a little bit too small in the forearm.   Recommend:  At this time the patient does not have appropriate extremity access for dialysis  Patient should have a left brachiocephalic created.  The risks, benefits and alternative therapies were reviewed in detail with the patient.  All questions  were answered.  The patient agrees to proceed with surgery.    HTN (hypertension) An underlying cause of his renal failure and blood pressure control important in reducing the progression of atherosclerotic disease. On appropriate oral medications.       Leotis Pain 03/01/2020, 9:41 AM   This note was created with Dragon medical transcription system.  Any errors from dictation are unintentional.

## 2020-03-02 ENCOUNTER — Ambulatory Visit: Admission: RE | Admit: 2020-03-02 | Payer: Self-pay | Source: Home / Self Care | Admitting: Surgery

## 2020-03-02 ENCOUNTER — Encounter: Admission: RE | Payer: Self-pay | Source: Home / Self Care

## 2020-03-02 SURGERY — LAPAROSCOPIC INSERTION CONTINUOUS AMBULATORY PERITONEAL DIALYSIS  (CAPD) CATHETER
Anesthesia: General

## 2020-03-03 ENCOUNTER — Telehealth (INDEPENDENT_AMBULATORY_CARE_PROVIDER_SITE_OTHER): Payer: Self-pay

## 2020-03-03 NOTE — Telephone Encounter (Signed)
Spoke with the patient's wife and he is scheduled with Dr.Dew for a left brachiocephalic AVF on 85/27/78 at the [facility]. Pre-op is on 03/07/20 at 2:00 pm with covid testing following after. Pre-surgical instructions were discussed and will be mailed.

## 2020-03-06 ENCOUNTER — Other Ambulatory Visit (INDEPENDENT_AMBULATORY_CARE_PROVIDER_SITE_OTHER): Payer: Self-pay | Admitting: Nurse Practitioner

## 2020-03-07 ENCOUNTER — Other Ambulatory Visit
Admission: RE | Admit: 2020-03-07 | Discharge: 2020-03-07 | Disposition: A | Discharge status: Home / Self Care | Payer: Medicaid Other | Source: Ambulatory Visit | Attending: Vascular Surgery | Admitting: Vascular Surgery

## 2020-03-07 ENCOUNTER — Other Ambulatory Visit: Payer: Self-pay

## 2020-03-07 DIAGNOSIS — Z01818 Encounter for other preprocedural examination: Secondary | ICD-10-CM | POA: Insufficient documentation

## 2020-03-07 DIAGNOSIS — Z20822 Contact with and (suspected) exposure to covid-19: Secondary | ICD-10-CM | POA: Diagnosis not present

## 2020-03-07 LAB — SARS CORONAVIRUS 2 (TAT 6-24 HRS): SARS Coronavirus 2: NEGATIVE

## 2020-03-08 MED ORDER — HYDROMORPHONE HCL 1 MG/ML IJ SOLN: 1.0000 mg | Freq: Once | INTRAMUSCULAR | Status: DC | PRN

## 2020-03-08 MED ORDER — ONDANSETRON HCL 4 MG/2ML IJ SOLN: 4.0000 mg | Freq: Four times a day (QID) | INTRAMUSCULAR | Status: DC | PRN

## 2020-03-08 MED ORDER — CHLORHEXIDINE GLUCONATE 0.12 % MT SOLN: 15.0000 mL | Freq: Once | OROMUCOSAL | Status: AC

## 2020-03-08 MED ORDER — SODIUM CHLORIDE 0.9 % IV SOLN: INTRAVENOUS | Status: DC

## 2020-03-08 MED ORDER — CEFAZOLIN SODIUM-DEXTROSE 1-4 GM/50ML-% IV SOLN
1.0000 g | INTRAVENOUS | Status: AC
  Administered 2020-03-09: 1 g via INTRAVENOUS

## 2020-03-08 MED ORDER — ORAL CARE MOUTH RINSE: 15.0000 mL | Freq: Once | OROMUCOSAL | Status: AC

## 2020-03-08 MED ORDER — FAMOTIDINE 20 MG PO TABS: 20.0000 mg | ORAL_TABLET | Freq: Once | ORAL | Status: AC

## 2020-03-09 ENCOUNTER — Encounter: Payer: Self-pay | Admitting: Vascular Surgery

## 2020-03-09 ENCOUNTER — Encounter
Admission: RE | Disposition: A | Discharge status: Home / Self Care | Payer: Self-pay | Source: Home / Self Care | Attending: Vascular Surgery

## 2020-03-09 ENCOUNTER — Ambulatory Visit
Admission: RE | Admit: 2020-03-09 | Discharge: 2020-03-09 | Disposition: A | Discharge status: Home / Self Care | Payer: Self-pay | Attending: Vascular Surgery | Admitting: Vascular Surgery

## 2020-03-09 ENCOUNTER — Ambulatory Visit: Payer: Self-pay | Admitting: Certified Registered Nurse Anesthetist

## 2020-03-09 ENCOUNTER — Other Ambulatory Visit: Payer: Self-pay

## 2020-03-09 DIAGNOSIS — N179 Acute kidney failure, unspecified: Secondary | ICD-10-CM | POA: Insufficient documentation

## 2020-03-09 DIAGNOSIS — N185 Chronic kidney disease, stage 5: Secondary | ICD-10-CM | POA: Insufficient documentation

## 2020-03-09 DIAGNOSIS — Z8249 Family history of ischemic heart disease and other diseases of the circulatory system: Secondary | ICD-10-CM | POA: Insufficient documentation

## 2020-03-09 DIAGNOSIS — Z79899 Other long term (current) drug therapy: Secondary | ICD-10-CM | POA: Insufficient documentation

## 2020-03-09 DIAGNOSIS — I120 Hypertensive chronic kidney disease with stage 5 chronic kidney disease or end stage renal disease: Secondary | ICD-10-CM | POA: Insufficient documentation

## 2020-03-09 DIAGNOSIS — Z8673 Personal history of transient ischemic attack (TIA), and cerebral infarction without residual deficits: Secondary | ICD-10-CM | POA: Insufficient documentation

## 2020-03-09 HISTORY — PX: AV FISTULA PLACEMENT: SHX1204

## 2020-03-09 LAB — TYPE AND SCREEN
ABO/RH(D): O POS
Antibody Screen: NEGATIVE

## 2020-03-09 LAB — CBC WITH DIFFERENTIAL/PLATELET
Abs Immature Granulocytes: 0.03 10*3/uL (ref 0.00–0.07)
Basophils Absolute: 0.1 10*3/uL (ref 0.0–0.1)
Basophils Relative: 1 %
Eosinophils Absolute: 0.5 10*3/uL (ref 0.0–0.5)
Eosinophils Relative: 8 %
HCT: 34 % — ABNORMAL LOW (ref 39.0–52.0)
Hemoglobin: 10.8 g/dL — ABNORMAL LOW (ref 13.0–17.0)
Immature Granulocytes: 1 %
Lymphocytes Relative: 26 %
Lymphs Abs: 1.7 10*3/uL (ref 0.7–4.0)
MCH: 26.2 pg (ref 26.0–34.0)
MCHC: 31.8 g/dL (ref 30.0–36.0)
MCV: 82.3 fL (ref 80.0–100.0)
Monocytes Absolute: 0.5 10*3/uL (ref 0.1–1.0)
Monocytes Relative: 8 %
Neutro Abs: 3.6 10*3/uL (ref 1.7–7.7)
Neutrophils Relative %: 56 %
Platelets: 315 10*3/uL (ref 150–400)
RBC: 4.13 MIL/uL — ABNORMAL LOW (ref 4.22–5.81)
RDW: 12.2 % (ref 11.5–15.5)
WBC: 6.4 10*3/uL (ref 4.0–10.5)
nRBC: 0 % (ref 0.0–0.2)

## 2020-03-09 LAB — BASIC METABOLIC PANEL
Anion gap: 12 (ref 5–15)
BUN: 77 mg/dL — ABNORMAL HIGH (ref 6–20)
CO2: 19 mmol/L — ABNORMAL LOW (ref 22–32)
Calcium: 9.1 mg/dL (ref 8.9–10.3)
Chloride: 108 mmol/L (ref 98–111)
Creatinine, Ser: 7.46 mg/dL — ABNORMAL HIGH (ref 0.61–1.24)
GFR, Estimated: 8 mL/min — ABNORMAL LOW (ref >60)
Glucose, Bld: 107 mg/dL — ABNORMAL HIGH (ref 70–99)
Potassium: 5 mmol/L (ref 3.5–5.1)
Sodium: 139 mmol/L (ref 135–145)

## 2020-03-09 LAB — PROTIME-INR
INR: 0.9 (ref 0.8–1.2)
Prothrombin Time: 12.1 seconds (ref 11.4–15.2)

## 2020-03-09 LAB — ABO/RH: ABO/RH(D): O POS

## 2020-03-09 LAB — APTT: aPTT: 29 seconds (ref 24–36)

## 2020-03-09 SURGERY — ARTERIOVENOUS (AV) FISTULA CREATION
Anesthesia: General | Laterality: Left

## 2020-03-09 MED ORDER — PROPOFOL 10 MG/ML IV BOLUS
INTRAVENOUS | Status: DC | PRN
  Administered 2020-03-09: 200 mg via INTRAVENOUS

## 2020-03-09 MED ORDER — EPHEDRINE 5 MG/ML INJ
INTRAVENOUS | Status: AC
  Filled 2020-03-09: qty 10

## 2020-03-09 MED ORDER — SODIUM CHLORIDE 0.9 % IV SOLN
INTRAVENOUS | Status: DC | PRN
  Administered 2020-03-09: 16:00:00 20 mL via INTRAMUSCULAR

## 2020-03-09 MED ORDER — FENTANYL CITRATE (PF) 100 MCG/2ML IJ SOLN: 25.0000 ug | INTRAMUSCULAR | Status: DC | PRN

## 2020-03-09 MED ORDER — HYDROCODONE-ACETAMINOPHEN 5-325 MG PO TABS
1.0000 | ORAL_TABLET | Freq: Four times a day (QID) | ORAL | Status: DC | PRN
  Administered 2020-03-09: 1 via ORAL

## 2020-03-09 MED ORDER — LIDOCAINE HCL (CARDIAC) PF 100 MG/5ML IV SOSY
PREFILLED_SYRINGE | INTRAVENOUS | Status: DC | PRN
  Administered 2020-03-09: 100 mg via INTRAVENOUS

## 2020-03-09 MED ORDER — HYDROCODONE-ACETAMINOPHEN 5-325 MG PO TABS
ORAL_TABLET | ORAL | Status: AC
  Filled 2020-03-09: qty 1

## 2020-03-09 MED ORDER — CHLORHEXIDINE GLUCONATE CLOTH 2 % EX PADS: 6.0000 | MEDICATED_PAD | Freq: Once | CUTANEOUS | Status: DC

## 2020-03-09 MED ORDER — HYDROCODONE-ACETAMINOPHEN 5-325 MG PO TABS
1.0000 | ORAL_TABLET | Freq: Four times a day (QID) | ORAL | 0 refills | Status: DC | PRN
Start: 2020-03-09 — End: 2020-04-28

## 2020-03-09 MED ORDER — FENTANYL CITRATE (PF) 100 MCG/2ML IJ SOLN
INTRAMUSCULAR | Status: DC | PRN
  Administered 2020-03-09 (×4): 25 ug via INTRAVENOUS

## 2020-03-09 MED ORDER — DEXAMETHASONE SODIUM PHOSPHATE 10 MG/ML IJ SOLN
INTRAMUSCULAR | Status: DC | PRN
  Administered 2020-03-09: 10 mg via INTRAVENOUS

## 2020-03-09 MED ORDER — ONDANSETRON HCL 4 MG/2ML IJ SOLN
INTRAMUSCULAR | Status: AC
  Filled 2020-03-09: qty 2

## 2020-03-09 MED ORDER — FENTANYL CITRATE (PF) 100 MCG/2ML IJ SOLN
INTRAMUSCULAR | Status: AC
  Filled 2020-03-09: qty 2

## 2020-03-09 MED ORDER — PROPOFOL 10 MG/ML IV BOLUS
INTRAVENOUS | Status: AC
  Filled 2020-03-09: qty 20

## 2020-03-09 MED ORDER — ONDANSETRON HCL 4 MG/2ML IJ SOLN: 4.0000 mg | Freq: Once | INTRAMUSCULAR | Status: DC | PRN

## 2020-03-09 MED ORDER — EPHEDRINE SULFATE 50 MG/ML IJ SOLN
INTRAMUSCULAR | Status: DC | PRN
  Administered 2020-03-09: 10 mg via INTRAVENOUS

## 2020-03-09 MED ORDER — HEPARIN SODIUM (PORCINE) 5000 UNIT/ML IJ SOLN
INTRAMUSCULAR | Status: AC
  Filled 2020-03-09: qty 1

## 2020-03-09 MED ORDER — HEPARIN SODIUM (PORCINE) 1000 UNIT/ML IJ SOLN
INTRAMUSCULAR | Status: DC | PRN
  Administered 2020-03-09: 3000 [IU] via INTRAVENOUS

## 2020-03-09 MED ORDER — CHLORHEXIDINE GLUCONATE 0.12 % MT SOLN
OROMUCOSAL | Status: AC
  Administered 2020-03-09: 15 mL via OROMUCOSAL
  Filled 2020-03-09: qty 15

## 2020-03-09 MED ORDER — ASPIRIN EC 81 MG PO TBEC: 81.0000 mg | DELAYED_RELEASE_TABLET | Freq: Every day | ORAL | 2 refills | Status: DC

## 2020-03-09 MED ORDER — PROPOFOL 500 MG/50ML IV EMUL
INTRAVENOUS | Status: DC | PRN
  Administered 2020-03-09: 20 ug/kg/min via INTRAVENOUS

## 2020-03-09 MED ORDER — CEFAZOLIN SODIUM-DEXTROSE 1-4 GM/50ML-% IV SOLN
INTRAVENOUS | Status: AC
  Filled 2020-03-09: qty 50

## 2020-03-09 MED ORDER — PHENYLEPHRINE HCL (PRESSORS) 10 MG/ML IV SOLN
INTRAVENOUS | Status: DC | PRN
  Administered 2020-03-09 (×3): 100 ug via INTRAVENOUS

## 2020-03-09 MED ORDER — FAMOTIDINE 20 MG PO TABS
ORAL_TABLET | ORAL | Status: AC
  Administered 2020-03-09: 20 mg via ORAL
  Filled 2020-03-09: qty 1

## 2020-03-09 MED ORDER — MIDAZOLAM HCL 2 MG/2ML IJ SOLN
INTRAMUSCULAR | Status: DC | PRN
  Administered 2020-03-09: 2 mg via INTRAVENOUS

## 2020-03-09 MED ORDER — ONDANSETRON HCL 4 MG/2ML IJ SOLN
INTRAMUSCULAR | Status: DC | PRN
  Administered 2020-03-09: 4 mg via INTRAVENOUS

## 2020-03-09 MED ORDER — MIDAZOLAM HCL 2 MG/2ML IJ SOLN
INTRAMUSCULAR | Status: AC
  Filled 2020-03-09: qty 2

## 2020-03-09 MED ORDER — PAPAVERINE HCL 30 MG/ML IJ SOLN
INTRAMUSCULAR | Status: DC | PRN
  Administered 2020-03-09: 1 mL

## 2020-03-09 MED ORDER — PAPAVERINE HCL 30 MG/ML IJ SOLN
INTRAMUSCULAR | Status: AC
  Filled 2020-03-09: qty 2

## 2020-03-09 MED ORDER — DEXAMETHASONE SODIUM PHOSPHATE 10 MG/ML IJ SOLN
INTRAMUSCULAR | Status: AC
  Filled 2020-03-09: qty 1

## 2020-03-09 MED ORDER — LIDOCAINE HCL (PF) 2 % IJ SOLN
INTRAMUSCULAR | Status: AC
  Filled 2020-03-09: qty 5

## 2020-03-09 SURGICAL SUPPLY — 53 items
ADH SKN CLS APL DERMABOND .7 (GAUZE/BANDAGES/DRESSINGS) ×1
APL PRP STRL LF DISP 70% ISPRP (MISCELLANEOUS) ×1
BAG DECANTER FOR FLEXI CONT (MISCELLANEOUS) ×3 IMPLANT
BLADE SURG SZ11 CARB STEEL (BLADE) ×3 IMPLANT
BOOT SUTURE AID YELLOW STND (SUTURE) ×3 IMPLANT
BRUSH SCRUB EZ  4% CHG (MISCELLANEOUS) ×2
BRUSH SCRUB EZ 4% CHG (MISCELLANEOUS) ×1 IMPLANT
CANISTER SUCT 1200ML W/VALVE (MISCELLANEOUS) ×3 IMPLANT
CHLORAPREP W/TINT 26 (MISCELLANEOUS) ×3 IMPLANT
CLIP SPRNG 6MM S-JAW DBL (CLIP) ×3
COVER WAND RF STERILE (DRAPES) ×3 IMPLANT
DERMABOND ADVANCED (GAUZE/BANDAGES/DRESSINGS) ×2
DERMABOND ADVANCED .7 DNX12 (GAUZE/BANDAGES/DRESSINGS) ×1 IMPLANT
ELECT CAUTERY BLADE 6.4 (BLADE) ×3 IMPLANT
ELECT REM PT RETURN 9FT ADLT (ELECTROSURGICAL) ×3
ELECTRODE REM PT RTRN 9FT ADLT (ELECTROSURGICAL) ×1 IMPLANT
GEL ULTRASOUND 20GR AQUASONIC (MISCELLANEOUS) IMPLANT
GLOVE BIO SURGEON STRL SZ7 (GLOVE) ×6 IMPLANT
GLOVE INDICATOR 7.5 STRL GRN (GLOVE) ×3 IMPLANT
GOWN STRL REUS W/ TWL LRG LVL3 (GOWN DISPOSABLE) ×1 IMPLANT
GOWN STRL REUS W/ TWL XL LVL3 (GOWN DISPOSABLE) ×1 IMPLANT
GOWN STRL REUS W/TWL LRG LVL3 (GOWN DISPOSABLE) ×3
GOWN STRL REUS W/TWL XL LVL3 (GOWN DISPOSABLE) ×3
HEMOSTAT SURGICEL 2X3 (HEMOSTASIS) ×3 IMPLANT
IV NS 500ML (IV SOLUTION) ×3
IV NS 500ML BAXH (IV SOLUTION) ×1 IMPLANT
KIT TURNOVER KIT A (KITS) ×3 IMPLANT
LABEL OR SOLS (LABEL) ×3 IMPLANT
LOOP RED MAXI  1X406MM (MISCELLANEOUS) ×2
LOOP VESSEL MAXI  1X406 RED (MISCELLANEOUS) ×1
LOOP VESSEL MAXI 1X406 RED (MISCELLANEOUS) ×1 IMPLANT
LOOP VESSEL MINI 0.8X406 BLUE (MISCELLANEOUS) ×1 IMPLANT
LOOPS BLUE MINI 0.8X406MM (MISCELLANEOUS) ×2
NEEDLE FILTER BLUNT 18X 1/2SAF (NEEDLE) ×2
NEEDLE FILTER BLUNT 18X1 1/2 (NEEDLE) ×1 IMPLANT
NS IRRIG 500ML POUR BTL (IV SOLUTION) IMPLANT
PACK EXTREMITY (MISCELLANEOUS) ×3 IMPLANT
PAD PREP 24X41 OB/GYN DISP (PERSONAL CARE ITEMS) ×3 IMPLANT
SOLUTION CELL SAVER (CLIP) ×1 IMPLANT
STOCKINETTE STRL 4IN 9604848 (GAUZE/BANDAGES/DRESSINGS) ×3 IMPLANT
SUT MNCRL AB 4-0 PS2 18 (SUTURE) ×3 IMPLANT
SUT PROLENE 6 0 BV (SUTURE) ×12 IMPLANT
SUT SILK 2 0 (SUTURE) ×3
SUT SILK 2-0 18XBRD TIE 12 (SUTURE) ×1 IMPLANT
SUT SILK 3 0 (SUTURE) ×3
SUT SILK 3-0 18XBRD TIE 12 (SUTURE) ×1 IMPLANT
SUT SILK 4 0 (SUTURE) ×3
SUT SILK 4-0 18XBRD TIE 12 (SUTURE) ×1 IMPLANT
SUT VIC AB 3-0 SH 27 (SUTURE) ×3
SUT VIC AB 3-0 SH 27X BRD (SUTURE) ×1 IMPLANT
SYR 20ML LL LF (SYRINGE) ×3 IMPLANT
SYR 3ML LL SCALE MARK (SYRINGE) ×3 IMPLANT
SYR TB 1ML 27GX1/2 LL (SYRINGE) IMPLANT

## 2020-03-09 NOTE — Anesthesia Procedure Notes (Signed)
Procedure Name: LMA Insertion Date/Time: 03/09/2020 2:37 PM Performed by: Eben Burow, CRNA Pre-anesthesia Checklist: Patient identified, Emergency Drugs available, Suction available and Patient being monitored Patient Re-evaluated:Patient Re-evaluated prior to induction Oxygen Delivery Method: Circle system utilized Preoxygenation: Pre-oxygenation with 100% oxygen Induction Type: IV induction Ventilation: Mask ventilation without difficulty LMA: LMA inserted LMA Size: 5.0 Number of attempts: 1 Placement Confirmation: positive ETCO2 and breath sounds checked- equal and bilateral Tube secured with: Tape Dental Injury: Teeth and Oropharynx as per pre-operative assessment

## 2020-03-09 NOTE — Transfer of Care (Signed)
Immediate Anesthesia Transfer of Care Note  Patient: Dominic Henry  Procedure(s) Performed: ARTERIOVENOUS (AV) FISTULA CREATION (BRACHIALBASILIC) (Left )  Patient Location: PACU  Anesthesia Type:General  Level of Consciousness: drowsy  Airway & Oxygen Therapy: Patient Spontanous Breathing and Patient connected to face mask oxygen  Post-op Assessment: Report given to RN and Post -op Vital signs reviewed and stable  Post vital signs: Reviewed and stable  Last Vitals:  Vitals Value Taken Time  BP 113/70 03/09/20 1554  Temp    Pulse 90 03/09/20 1556  Resp 19 03/09/20 1556  SpO2 94 % 03/09/20 1556  Vitals shown include unvalidated device data.  Last Pain:  Vitals:   03/09/20 1200  TempSrc: Oral  PainSc: 0-No pain         Complications: No complications documented.

## 2020-03-09 NOTE — Interval H&P Note (Signed)
History and Physical Interval Note:  03/09/2020 11:49 AM  Dominic Henry  has presented today for surgery, with the diagnosis of ESRD.  The various methods of treatment have been discussed with the patient and family. After consideration of risks, benefits and other options for treatment, the patient has consented to  Procedure(s): ARTERIOVENOUS (AV) FISTULA CREATION (BRACHIALCEPHALIC ) (Left) as a surgical intervention.  The patient's history has been reviewed, patient examined, no change in status, stable for surgery.  I have reviewed the patient's chart and labs.  Questions were answered to the patient's satisfaction.     Leotis Pain

## 2020-03-09 NOTE — Discharge Instructions (Signed)

## 2020-03-09 NOTE — Op Note (Signed)
OPERATIVE NOTE   PROCEDURE: 1. Left brachiobasilic arteriovenous fistula placement  PRE-OPERATIVE DIAGNOSIS: CKD stage 5  POST-OPERATIVE DIAGNOSIS: same as above  SURGEON: Leotis Pain, MD  ASSISTANT(S): none  ANESTHESIA: geenral  ESTIMATED BLOOD LOSS: 10 cc  FINDING(S): 1. Left cephalic vein did not appear adequate for fistula creation. Basilic vein was large and did appear adequate for fistula creation.  SPECIMEN(S): None  INDICATIONS:   Dominic Henry is a 44 y.o. male who presents with advanced renal failure needing dialysis access.  The patient is scheduled for left arm AVF creation.  The patient is aware the risks include but are not limited to: bleeding, infection, steal syndrome, nerve damage, ischemic monomelic neuropathy, failure to mature, and need for additional procedures.  The patient is aware of the risks of the procedure and elects to proceed forward.  DESCRIPTION: After full informed written consent was obtained from the patient, the patient was brought back to the operating room and placed supine upon the operating table.  Prior to induction, the patient received IV antibiotics.   After obtaining adequate anesthesia, the patient was then prepped and draped in the standard fashion for a left arm access procedure.  I turned my attention first to identifying the patient's basilic vein and brachial artery.   I made a curvilinear incision at the level of the antecubital fossa and dissected through the subcutaneous tissue and fascia to gain exposure of the brachial artery.  This was noted to be suitable for fistula creation.  This was dissected out proximally and distally and controlled with vessel loops. I then dissected out the cephalic vein with the intention of creating a brachiocephalic AV fistula. The vein and the typical location of the cephalic vein is very small and clearly not usable for fistula creation. If there was a very large lateral branch that could  have been used, given the size of his arm hard to swing that over to the brachial artery anyway, and there was a large basilic vein that was clearly going to be usable. I elected to perform a brachiobasilic AV fistula even though that would mean a second stage operation would be necessary.  I then dissected out the basilic vein.  This was noted to be suitable for fistula creation. The patient was then heparinized.  The vein was marked for orientation and distal segment of the vein was ligated with a  2-0 silk, and the vein was transected.  I then instilled the heparinized saline into the vein and clamped it.  At this point, I reset my exposure of the brachial artery and placed the artery under tension proximally and distally.  I made an arteriotomy with a #11 blade, and then I extended the arteriotomy with a Potts scissor.  I then irrigated the artery with heparinized saline.  The vein was cut and beveled to appropriate length to match the arteriotomy.  The vein was then sewn to the artery in an end-to-side configuration with a running 6-0 Prolene suture.  Prior to completing this anastomosis, I allowed the vein and artery to backbleed.  There was no evidence of clot from any vessels.  I completed the anastomosis in the usual fashion and then released all vessel loops and clamps.  There was a palpable  thrill in the venous outflow, and there was a pulse in the brachial artery beyond the fistula anastomosis.  At this point, I irrigated out the surgical wound and placed Surgicel.  There was no further active  bleeding.  The subcutaneous tissue was reapproximated with a running stitch of 3-0 Vicryl.  The skin was then reapproximated with a running subcuticular stitch of 4-0 Monocryl.  The skin was then cleaned, dried, and reinforced with Dermabond.  The patient tolerated this procedure well and was taken to the recovery room in stable condition.  COMPLICATIONS: None  CONDITION: Stable  Leotis Pain  03/09/2020,  3:48 PM    This note was created with Dragon Medical transcription system. Any errors in dictation are purely unintentional.

## 2020-03-09 NOTE — Anesthesia Preprocedure Evaluation (Signed)
Anesthesia Evaluation  Patient identified by MRN, date of birth, ID band Patient awake    Reviewed: Allergy & Precautions, H&P , NPO status , Patient's Chart, lab work & pertinent test results, reviewed documented beta blocker date and time   Airway Mallampati: III  TM Distance: >3 FB Neck ROM: full    Dental  (+) Teeth Intact   Pulmonary pneumonia, resolved,    Pulmonary exam normal        Cardiovascular Exercise Tolerance: Good hypertension, On Medications negative cardio ROS Normal cardiovascular exam Rate:Normal     Neuro/Psych CVA, No Residual Symptoms negative psych ROS   GI/Hepatic negative GI ROS, Neg liver ROS,   Endo/Other  negative endocrine ROS  Renal/GU ESRFRenal disease  negative genitourinary   Musculoskeletal   Abdominal   Peds  Hematology  (+) Blood dyscrasia, anemia ,   Anesthesia Other Findings   Reproductive/Obstetrics negative OB ROS                             Anesthesia Physical Anesthesia Plan  ASA: III  Anesthesia Plan: General LMA   Post-op Pain Management:    Induction:   PONV Risk Score and Plan: 3  Airway Management Planned:   Additional Equipment:   Intra-op Plan:   Post-operative Plan:   Informed Consent: I have reviewed the patients History and Physical, chart, labs and discussed the procedure including the risks, benefits and alternatives for the proposed anesthesia with the patient or authorized representative who has indicated his/her understanding and acceptance.       Plan Discussed with: CRNA  Anesthesia Plan Comments:         Anesthesia Quick Evaluation

## 2020-03-10 ENCOUNTER — Encounter: Payer: Self-pay | Admitting: Vascular Surgery

## 2020-03-13 NOTE — Anesthesia Postprocedure Evaluation (Signed)
Anesthesia Post Note  Patient: Dominic Henry  Procedure(s) Performed: ARTERIOVENOUS (AV) FISTULA CREATION (BRACHIALBASILIC) (Left )  Patient location during evaluation: PACU Anesthesia Type: General Level of consciousness: awake and alert and oriented Pain management: pain level controlled Vital Signs Assessment: post-procedure vital signs reviewed and stable Respiratory status: spontaneous breathing Cardiovascular status: blood pressure returned to baseline Anesthetic complications: no   No complications documented.   Last Vitals:  Vitals:   03/09/20 1649 03/09/20 1656  BP:  (!) 128/91  Pulse: 88   Resp: 15   Temp: (!) 36.2 C   SpO2: 93%     Last Pain:  Vitals:   03/10/20 0959  TempSrc:   PainSc: 2                  Maxten Shuler

## 2020-04-11 ENCOUNTER — Other Ambulatory Visit (INDEPENDENT_AMBULATORY_CARE_PROVIDER_SITE_OTHER): Payer: Self-pay | Admitting: Vascular Surgery

## 2020-04-11 DIAGNOSIS — Z9889 Other specified postprocedural states: Secondary | ICD-10-CM

## 2020-04-11 DIAGNOSIS — N185 Chronic kidney disease, stage 5: Secondary | ICD-10-CM

## 2020-04-18 ENCOUNTER — Other Ambulatory Visit: Payer: Self-pay

## 2020-04-18 ENCOUNTER — Ambulatory Visit (INDEPENDENT_AMBULATORY_CARE_PROVIDER_SITE_OTHER): Payer: Self-pay

## 2020-04-18 ENCOUNTER — Encounter (INDEPENDENT_AMBULATORY_CARE_PROVIDER_SITE_OTHER): Payer: Self-pay | Admitting: Nurse Practitioner

## 2020-04-18 ENCOUNTER — Telehealth (INDEPENDENT_AMBULATORY_CARE_PROVIDER_SITE_OTHER): Payer: Self-pay

## 2020-04-18 ENCOUNTER — Ambulatory Visit (INDEPENDENT_AMBULATORY_CARE_PROVIDER_SITE_OTHER): Payer: Self-pay | Admitting: Nurse Practitioner

## 2020-04-18 VITALS — BP 136/86 | HR 78 | Ht 69.0 in | Wt 256.0 lb

## 2020-04-18 DIAGNOSIS — I10 Essential (primary) hypertension: Secondary | ICD-10-CM

## 2020-04-18 DIAGNOSIS — Z9889 Other specified postprocedural states: Secondary | ICD-10-CM

## 2020-04-18 DIAGNOSIS — N185 Chronic kidney disease, stage 5: Secondary | ICD-10-CM

## 2020-04-18 NOTE — Telephone Encounter (Signed)
I attempted to contact the patient and a message was left for a return call. 

## 2020-04-18 NOTE — Telephone Encounter (Signed)
Patient's wife called back and he is now scheduled with Dr. Lucky Cowboy for a LUE angio n 04/20/20 with a 11:45 am arrival time to the [facility]. Covid testing on 04/19/20 before 11:00 am at the [facility]. Pre-procedure instructions were discussed.

## 2020-04-19 ENCOUNTER — Other Ambulatory Visit
Admission: RE | Admit: 2020-04-19 | Discharge: 2020-04-19 | Disposition: A | Discharge status: Home / Self Care | Payer: Medicaid Other | Source: Ambulatory Visit | Attending: Vascular Surgery | Admitting: Vascular Surgery

## 2020-04-19 DIAGNOSIS — Z01812 Encounter for preprocedural laboratory examination: Secondary | ICD-10-CM | POA: Diagnosis not present

## 2020-04-19 DIAGNOSIS — Z20822 Contact with and (suspected) exposure to covid-19: Secondary | ICD-10-CM | POA: Insufficient documentation

## 2020-04-19 LAB — SARS CORONAVIRUS 2 (TAT 6-24 HRS): SARS Coronavirus 2: NEGATIVE

## 2020-04-20 ENCOUNTER — Encounter: Payer: Self-pay | Admitting: *Deleted

## 2020-04-20 ENCOUNTER — Other Ambulatory Visit: Payer: Self-pay

## 2020-04-20 ENCOUNTER — Encounter
Admission: RE | Disposition: A | Discharge status: Home / Self Care | Payer: Self-pay | Source: Home / Self Care | Attending: Vascular Surgery

## 2020-04-20 ENCOUNTER — Ambulatory Visit
Admission: RE | Admit: 2020-04-20 | Discharge: 2020-04-20 | Disposition: A | Discharge status: Home / Self Care | Payer: Medicaid Other | Attending: Vascular Surgery | Admitting: Vascular Surgery

## 2020-04-20 ENCOUNTER — Encounter: Payer: Self-pay | Admitting: Vascular Surgery

## 2020-04-20 ENCOUNTER — Other Ambulatory Visit (INDEPENDENT_AMBULATORY_CARE_PROVIDER_SITE_OTHER): Payer: Self-pay | Admitting: Nurse Practitioner

## 2020-04-20 DIAGNOSIS — N185 Chronic kidney disease, stage 5: Secondary | ICD-10-CM | POA: Insufficient documentation

## 2020-04-20 DIAGNOSIS — I120 Hypertensive chronic kidney disease with stage 5 chronic kidney disease or end stage renal disease: Secondary | ICD-10-CM | POA: Diagnosis not present

## 2020-04-20 DIAGNOSIS — Y832 Surgical operation with anastomosis, bypass or graft as the cause of abnormal reaction of the patient, or of later complication, without mention of misadventure at the time of the procedure: Secondary | ICD-10-CM | POA: Diagnosis not present

## 2020-04-20 DIAGNOSIS — T82898A Other specified complication of vascular prosthetic devices, implants and grafts, initial encounter: Secondary | ICD-10-CM

## 2020-04-20 DIAGNOSIS — N186 End stage renal disease: Secondary | ICD-10-CM

## 2020-04-20 DIAGNOSIS — Z7982 Long term (current) use of aspirin: Secondary | ICD-10-CM | POA: Insufficient documentation

## 2020-04-20 DIAGNOSIS — Z992 Dependence on renal dialysis: Secondary | ICD-10-CM

## 2020-04-20 DIAGNOSIS — L7622 Postprocedural hemorrhage and hematoma of skin and subcutaneous tissue following other procedure: Secondary | ICD-10-CM | POA: Diagnosis present

## 2020-04-20 DIAGNOSIS — I7789 Other specified disorders of arteries and arterioles: Secondary | ICD-10-CM

## 2020-04-20 DIAGNOSIS — Z5321 Procedure and treatment not carried out due to patient leaving prior to being seen by health care provider: Secondary | ICD-10-CM | POA: Diagnosis not present

## 2020-04-20 DIAGNOSIS — Z79899 Other long term (current) drug therapy: Secondary | ICD-10-CM | POA: Diagnosis not present

## 2020-04-20 HISTORY — PX: UPPER EXTREMITY ANGIOGRAPHY: CATH118270

## 2020-04-20 LAB — POTASSIUM (ARMC VASCULAR LAB ONLY): Potassium (ARMC vascular lab): 4.5 (ref 3.5–5.1)

## 2020-04-20 SURGERY — UPPER EXTREMITY ANGIOGRAPHY
Anesthesia: Moderate Sedation | Laterality: Left

## 2020-04-20 MED ORDER — HYDROMORPHONE HCL 1 MG/ML IJ SOLN: 1.0000 mg | Freq: Once | INTRAMUSCULAR | Status: DC | PRN

## 2020-04-20 MED ORDER — FAMOTIDINE 20 MG PO TABS: 40.0000 mg | ORAL_TABLET | Freq: Once | ORAL | Status: DC | PRN

## 2020-04-20 MED ORDER — FENTANYL CITRATE (PF) 100 MCG/2ML IJ SOLN
INTRAMUSCULAR | Status: AC
  Filled 2020-04-20: qty 2

## 2020-04-20 MED ORDER — DIPHENHYDRAMINE HCL 50 MG/ML IJ SOLN: 50.0000 mg | Freq: Once | INTRAMUSCULAR | Status: DC | PRN

## 2020-04-20 MED ORDER — HEPARIN SODIUM (PORCINE) 1000 UNIT/ML IJ SOLN
INTRAMUSCULAR | Status: DC | PRN
  Administered 2020-04-20: 4000 [IU] via INTRAVENOUS

## 2020-04-20 MED ORDER — FENTANYL CITRATE (PF) 100 MCG/2ML IJ SOLN
INTRAMUSCULAR | Status: DC | PRN
  Administered 2020-04-20: 50 ug via INTRAVENOUS
  Administered 2020-04-20: 25 ug via INTRAVENOUS

## 2020-04-20 MED ORDER — METHYLPREDNISOLONE SODIUM SUCC 125 MG IJ SOLR: 125.0000 mg | Freq: Once | INTRAMUSCULAR | Status: DC | PRN

## 2020-04-20 MED ORDER — HEPARIN SODIUM (PORCINE) 1000 UNIT/ML IJ SOLN
INTRAMUSCULAR | Status: AC
  Filled 2020-04-20: qty 1

## 2020-04-20 MED ORDER — IODIXANOL 320 MG/ML IV SOLN
INTRAVENOUS | Status: DC | PRN
  Administered 2020-04-20: 85 mL

## 2020-04-20 MED ORDER — CEFAZOLIN SODIUM-DEXTROSE 1-4 GM/50ML-% IV SOLN
INTRAVENOUS | Status: AC
  Filled 2020-04-20: qty 50

## 2020-04-20 MED ORDER — MIDAZOLAM HCL 5 MG/5ML IJ SOLN
INTRAMUSCULAR | Status: AC
  Filled 2020-04-20: qty 5

## 2020-04-20 MED ORDER — MIDAZOLAM HCL 2 MG/ML PO SYRP: 8.0000 mg | ORAL_SOLUTION | Freq: Once | ORAL | Status: DC | PRN

## 2020-04-20 MED ORDER — SODIUM CHLORIDE 0.9 % IV SOLN: INTRAVENOUS | Status: DC

## 2020-04-20 MED ORDER — MIDAZOLAM HCL 2 MG/2ML IJ SOLN
INTRAMUSCULAR | Status: DC | PRN
  Administered 2020-04-20: 2 mg via INTRAVENOUS
  Administered 2020-04-20: 1 mg via INTRAVENOUS

## 2020-04-20 MED ORDER — ONDANSETRON HCL 4 MG/2ML IJ SOLN: 4.0000 mg | Freq: Four times a day (QID) | INTRAMUSCULAR | Status: DC | PRN

## 2020-04-20 MED ORDER — CEFAZOLIN SODIUM-DEXTROSE 1-4 GM/50ML-% IV SOLN
1.0000 g | Freq: Once | INTRAVENOUS | Status: AC
  Administered 2020-04-20: 1 g via INTRAVENOUS

## 2020-04-20 SURGICAL SUPPLY — 10 items
CATH ANGIO 5F 100CM .035 PIG (CATHETERS) ×2 IMPLANT
CATH BEACON 5 .035 100 H1 TIP (CATHETERS) ×2 IMPLANT
DEVICE STARCLOSE SE CLOSURE (Vascular Products) ×2 IMPLANT
DEVICE TORQUE .025-.038 (MISCELLANEOUS) ×2 IMPLANT
GLIDEWIRE ANGLED SS 035X260CM (WIRE) ×2 IMPLANT
PACK ANGIOGRAPHY (CUSTOM PROCEDURE TRAY) ×2 IMPLANT
SHEATH BRITE TIP 5FRX11 (SHEATH) ×2 IMPLANT
SYR MEDRAD MARK 7 150ML (SYRINGE) ×2 IMPLANT
TUBING CONTRAST HIGH PRESS 72 (TUBING) ×2 IMPLANT
WIRE GUIDERIGHT .035X150 (WIRE) ×2 IMPLANT

## 2020-04-20 NOTE — ED Notes (Signed)
Attempted to room patient with no answer from lobby or flex wait.

## 2020-04-20 NOTE — H&P (View-Only) (Signed)
Subjective:    Patient ID: Dominic Henry, male    DOB: [birth year], 44 y.o.   MRN: [id number] Chief Complaint  Patient presents with  . Follow-up    5 wk [facility] hda     Patient presents today for first look of his left brachiobasilic AV fistula creation.  His AV fistula was created on 03/09/2020.  Initially a brachiocephalic fistula was planned however there was visualization of the cephalic vein it was decided that a brachiobasilic AV fistula creation would be more appropriate.  The patient notes that there is no oozing or redness around the actual incision area however he has been having some numbness in his forearm.  He also notes that his hand gets cold at times in addition to having numbness and tingling.  He denies any open wounds or ulcerations.  The symptoms are not persistent but they do happen frequently.  Patient has not yet begun dialysis.  Today noninvasive studies show a flow volume of 916.  There is retrograde flow noted in the left radial artery.  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   Review of Systems  Skin: Positive for wound.  Neurological: Positive for numbness.  All other systems reviewed and are negative.      Objective:   Physical Exam Vitals reviewed.  HENT:     Head: Normocephalic.  Cardiovascular:     Rate and Rhythm: Normal rate.     Pulses:          Radial pulses are 0 on the left side.     Arteriovenous access: left arteriovenous access is present.    Comments: Good thrill and bruit Pulmonary:     Effort: Pulmonary effort is normal.  Neurological:     Mental Status: He is alert and oriented to person, place, and time.  Psychiatric:        Mood and Affect: Mood normal.        Behavior: Behavior normal.        Thought Content: Thought content normal.        Judgment: Judgment normal.     BP 136/86   Pulse 78   Ht 5\' 9"  (1.753 m)   Wt 256 lb (116.1 kg)   BMI 37.80 kg/m   Past Medical History:  Diagnosis Date  . CKD  (chronic kidney disease) stage 4, GFR 15-29 ml/min (HCC) 12/16/2016  . CKD (chronic kidney disease), stage III ([place]) 12/16/2016  . Hypertension 05/21/2013  . Stroke [facility]) 05/21/2014    Social History   Socioeconomic History  . Marital status: Married    Spouse name: Not on file  . Number of children: Not on file  . Years of education: Not on file  . Highest education level: Not on file  Occupational History  . Not on file  Tobacco Use  . Smoking status: Never Smoker  . Smokeless tobacco: Never Used  Vaping Use  . Vaping Use: Never used  Substance and Sexual Activity  . Alcohol use: No  . Drug use: No  . Sexual activity: Not on file  Other Topics Concern  . Not on file  Social History Narrative  . Not on file   Social Determinants of Health   Financial Resource Strain:   . Difficulty of Paying Living Expenses: Not on file  Food Insecurity:   . Worried About Charity fundraiser in the Last Year: Not on file  . Ran Out of Food in the Last Year: Not on  file  Transportation Needs:   . Film/video editor (Medical): Not on file  . Lack of Transportation (Non-Medical): Not on file  Physical Activity:   . Days of Exercise per Week: Not on file  . Minutes of Exercise per Session: Not on file  Stress:   . Feeling of Stress : Not on file  Social Connections:   . Frequency of Communication with Friends and Family: Not on file  . Frequency of Social Gatherings with Friends and Family: Not on file  . Attends Religious Services: Not on file  . Active Member of Clubs or Organizations: Not on file  . Attends Archivist Meetings: Not on file  . Marital Status: Not on file  Intimate Partner Violence:   . Fear of Current or Ex-Partner: Not on file  . Emotionally Abused: Not on file  . Physically Abused: Not on file  . Sexually Abused: Not on file    Past Surgical History:  Procedure Laterality Date  . AV FISTULA PLACEMENT Left 03/09/2020   Procedure: ARTERIOVENOUS  (AV) FISTULA CREATION (BRACHIALBASILIC);  Surgeon: Algernon Huxley, MD;  Location: [facility] ORS;  Service: Vascular;  Laterality: Left;    Family History  Problem Relation Age of Onset  . Hypertension Mother   . Diabetes Mother   . Hypertension Father     No Known Allergies  CBC Latest Ref Rng & Units 03/09/2020 12/12/2018 01/01/2018  WBC 4.0 - 10.5 K/uL 6.4 8.1 4.8  Hemoglobin 13.0 - 17.0 g/dL 10.8(L) 12.8(L) 10.2(L)  Hematocrit 39 - 52 % 34.0(L) 41.5 30.9(L)  Platelets 150 - 400 K/uL 315 328 272      CMP     Component Value Date/Time   NA 139 03/09/2020 1155   K 5.0 03/09/2020 1155   CL 108 03/09/2020 1155   CO2 19 (L) 03/09/2020 1155   GLUCOSE 107 (H) 03/09/2020 1155   BUN 77 (H) 03/09/2020 1155   CREATININE 7.46 (H) 03/09/2020 1155   CALCIUM 9.1 03/09/2020 1155   CALCIUM 9.3 11/29/2017 1032   PROT 7.7 12/30/2017 1155   ALBUMIN 3.8 12/30/2017 1155   AST 27 12/30/2017 1155   ALT 46 (H) 12/30/2017 1155   ALKPHOS 85 12/30/2017 1155   BILITOT 0.6 12/30/2017 1155   GFRNONAA 8 (L) 03/09/2020 1155   GFRAA 23 (L) 12/12/2018 2339     No results found.     Assessment & Plan:   1. Chronic kidney disease, stage 5 (HCC) Recommend:  The patient is experiencing increasing problems with their dialysis access.  Patient should have a left upper extremity angiogram with the intention for intervention.  The intention for intervention is to restore appropriate flow and prevent thrombosis and possible loss of the access in addition to preventing worsening of steal syndrome.  Because the brachiobasilic requires a second surgery it is prudent to attempt to fix his steal syndrome before proceeding with the second stage.    The risks, benefits and alternative therapies were reviewed in detail with the patient.  All questions were answered.  The patient agrees to proceed with angio/intervention.      2. Primary hypertension Continue antihypertensive medications as already ordered, these  medications have been reviewed and there are no changes at this time.    No current facility-administered medications on file prior to visit.   Current Outpatient Medications on File Prior to Visit  Medication Sig Dispense Refill  . amLODipine (NORVASC) 10 MG tablet Take 10 mg by mouth daily.    Marland Kitchen  aspirin EC 81 MG tablet Take 1 tablet (81 mg total) by mouth daily. 150 tablet 2  . Cholecalciferol 50 MCG (2000 UT) TABS Take 2,000 Units by mouth daily.     Marland Kitchen HYDROcodone-acetaminophen (NORCO) 5-325 MG tablet Take 1 tablet by mouth every 6 (six) hours as needed for moderate pain. (Patient not taking: Reported on 04/20/2020) 30 tablet 0  . sodium bicarbonate 650 MG tablet Take 1,300 mg by mouth in the morning and at bedtime.     . torsemide (DEMADEX) 20 MG tablet Take 20 mg by mouth 2 (two) times daily.    Marland Kitchen amLODipine (NORVASC) 5 MG tablet Take 1 tablet (5 mg total) by mouth daily. For hypertension 30 tablet 1  . losartan (COZAAR) 25 MG tablet Take 25 mg by mouth daily. (Patient not taking: Reported on 03/09/2020)      There are no Patient Instructions on file for this visit. No follow-ups on file.   Kris Hartmann, NP

## 2020-04-20 NOTE — Discharge Instructions (Signed)
Angiogram, Care After This sheet gives you information about how to care for yourself after your procedure. Your doctor may also give you more specific instructions. If you have problems or questions, contact your doctor. Follow these instructions at home: Insertion site care  Follow instructions from your doctor about how to take care of your long, thin tube (catheter) insertion area. Make sure you: ? Wash your hands with soap and water before you change your bandage (dressing). If you cannot use soap and water, use hand sanitizer. ? Change your bandage as told by your doctor. ? Leave stitches (sutures), skin glue, or skin tape (adhesive) strips in place. They may need to stay in place for 2 weeks or longer. If tape strips get loose and curl up, you may trim the loose edges. Do not remove tape strips completely unless your doctor says it is okay.  Do not take baths, swim, or use a hot tub until your doctor says it is okay.  You may shower 24-48 hours after the procedure or as told by your doctor. ? Gently wash the area with plain soap and water. ? Pat the area dry with a clean towel. ? Do not rub the area. This may cause bleeding.  Do not apply powder or lotion to the area. Keep the area clean and dry.  Check your insertion area every day for signs of infection. Check for: ? More redness, swelling, or pain. ? Fluid or blood. ? Warmth. ? Pus or a bad smell. Activity  Rest as told by your doctor, usually for 1-2 days.  Do not lift anything that is heavier than 10 lbs. (4.5 kg) or as told by your doctor.  Do not drive for 24 hours if you were given a medicine to help you relax (sedative).  Do not drive or use heavy machinery while taking prescription pain medicine. General instructions   Go back to your normal activities as told by your doctor, usually in about a week. Ask your doctor what activities are safe for you.  If the insertion area starts to bleed, lie flat and put  pressure on the area. If the bleeding does not stop, get help right away. This is an emergency.  Drink enough fluid to keep your pee (urine) clear or pale yellow.  Take over-the-counter and prescription medicines only as told by your doctor.  Keep all follow-up visits as told by your doctor. This is important. Contact a doctor if:  You have a fever.  You have chills.  You have more redness, swelling, or pain around your insertion area.  You have fluid or blood coming from your insertion area.  The insertion area feels warm to the touch.  You have pus or a bad smell coming from your insertion area.  You have more bruising around the insertion area.  Blood collects in the tissue around the insertion area (hematoma) that may be painful to the touch. Get help right away if:  You have a lot of pain in the insertion area.  The insertion area swells very fast.  The insertion area is bleeding, and the bleeding does not stop after holding steady pressure on the area.  The area near or just beyond the insertion area becomes pale, cool, tingly, or numb. These symptoms may be an emergency. Do not wait to see if the symptoms will go away. Get medical help right away. Call your local emergency services (911 in the [place] Do not drive yourself to the hospital.   Summary  After the procedure, it is common to have bruising and tenderness at the long, thin tube insertion area.  After the procedure, it is important to rest and drink plenty of fluids.  Do not take baths, swim, or use a hot tub until your doctor says it is okay to do so. You may shower 24-48 hours after the procedure or as told by your doctor.  If the insertion area starts to bleed, lie flat and put pressure on the area. If the bleeding does not stop, get help right away. This is an emergency. This information is not intended to replace advice given to you by your health care provider. Make sure you discuss any questions you have  with your health care provider. Document Revised: 04/19/2017 Document Reviewed: 05/01/2016 Elsevier Patient Education  2020 Elsevier Inc.   Femoral Site Care This sheet gives you information about how to care for yourself after your procedure. Your health care provider may also give you more specific instructions. If you have problems or questions, contact your health care provider. What can I expect after the procedure? After the procedure, it is common to have:  Bruising that usually fades within 1-2 weeks.  Tenderness at the site. Follow these instructions at home: Wound care  Follow instructions from your health care provider about how to take care of your insertion site. Make sure you: ? Wash your hands with soap and water before you change your bandage (dressing). If soap and water are not available, use hand sanitizer. ? Change your dressing as told by your health care provider. ? Leave stitches (sutures), skin glue, or adhesive strips in place. These skin closures may need to stay in place for 2 weeks or longer. If adhesive strip edges start to loosen and curl up, you may trim the loose edges. Do not remove adhesive strips completely unless your health care provider tells you to do that.  Do not take baths, swim, or use a hot tub until your health care provider approves.  You may shower 24-48 hours after the procedure or as told by your health care provider. ? Gently wash the site with plain soap and water. ? Pat the area dry with a clean towel. ? Do not rub the site. This may cause bleeding.  Do not apply powder or lotion to the site. Keep the site clean and dry.  Check your femoral site every day for signs of infection. Check for: ? Redness, swelling, or pain. ? Fluid or blood. ? Warmth. ? Pus or a bad smell. Activity  For the first 2-3 days after your procedure, or as long as directed: ? Avoid climbing stairs as much as possible. ? Do not squat.  Do not lift  anything that is heavier than 10 lb (4.5 kg), or the limit that you are told, until your health care provider says that it is safe.  Rest as directed. ? Avoid sitting for a long time without moving. Get up to take short walks every 1-2 hours.  Do not drive for 24 hours if you were given a medicine to help you relax (sedative). General instructions  Take over-the-counter and prescription medicines only as told by your health care provider.  Keep all follow-up visits as told by your health care provider. This is important. Contact a health care provider if you have:  A fever or chills.  You have redness, swelling, or pain around your insertion site. Get help right away if:  The catheter   insertion area swells very fast.  You pass out.  You suddenly start to sweat or your skin gets clammy.  The catheter insertion area is bleeding, and the bleeding does not stop when you hold steady pressure on the area.  The area near or just beyond the catheter insertion site becomes pale, cool, tingly, or numb. These symptoms may represent a serious problem that is an emergency. Do not wait to see if the symptoms will go away. Get medical help right away. Call your local emergency services (911 in the [place] Do not drive yourself to the hospital. Summary  After the procedure, it is common to have bruising that usually fades within 1-2 weeks.  Check your femoral site every day for signs of infection.  Do not lift anything that is heavier than 10 lb (4.5 kg), or the limit that you are told, until your health care provider says that it is safe. This information is not intended to replace advice given to you by your health care provider. Make sure you discuss any questions you have with your health care provider. Document Revised: 05/20/2017 Document Reviewed: 05/20/2017 Elsevier Patient Education  2020 Elsevier Inc.   Moderate Conscious Sedation, Adult, Care After These instructions provide you  with information about caring for yourself after your procedure. Your health care provider may also give you more specific instructions. Your treatment has been planned according to current medical practices, but problems sometimes occur. Call your health care provider if you have any problems or questions after your procedure. What can I expect after the procedure? After your procedure, it is common:  To feel sleepy for several hours.  To feel clumsy and have poor balance for several hours.  To have poor judgment for several hours.  To vomit if you eat too soon. Follow these instructions at home: For at least 24 hours after the procedure:   Do not: ? Participate in activities where you could fall or become injured. ? Drive. ? Use heavy machinery. ? Drink alcohol. ? Take sleeping pills or medicines that cause drowsiness. ? Make important decisions or sign legal documents. ? Take care of children on your own.  Rest. Eating and drinking  Follow the diet recommended by your health care provider.  If you vomit: ? Drink water, juice, or soup when you can drink without vomiting. ? Make sure you have little or no nausea before eating solid foods. General instructions  Have a responsible adult stay with you until you are awake and alert.  Take over-the-counter and prescription medicines only as told by your health care provider.  If you smoke, do not smoke without supervision.  Keep all follow-up visits as told by your health care provider. This is important. Contact a health care provider if:  You keep feeling nauseous or you keep vomiting.  You feel light-headed.  You develop a rash.  You have a fever. Get help right away if:  You have trouble breathing. This information is not intended to replace advice given to you by your health care provider. Make sure you discuss any questions you have with your health care provider. Document Revised: 04/19/2017 Document Reviewed:  08/27/2015 Elsevier Patient Education  2020 Elsevier Inc.  

## 2020-04-20 NOTE — Op Note (Signed)
OPERATIVE REPORT   PREOPERATIVE DIAGNOSIS: 1. End-stage renal disease. 2. 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  POSTOPERATIVE DIAGNOSIS: Same as above except the fistula was based off of the radial artery due to a very high brachial/axillary bifurcation  PROCEDURE PERFORMED: 1. Ultrasound guidance vascular access to right femoral artery. 2. Catheter placement to left radial artery and left ulnar arteries  from right femoral approach. 3. Thoracic aortogram and selective left upper extremity angiogram  including selective images of the radial and ulnar arteries. 4. StarClose closure device right femoral artery.  SURGEON: Algernon Huxley, MD  ANESTHESIA: Local with moderate conscious sedation for 31 minutes using 3 mg of Versed and 75 mcg of Fentanyl  BLOOD LOSS: Minimal.  FLUOROSCOPY TIME:4.4 minutes  INDICATION FOR PROCEDURE: This is a 44 y.o.male who presented to our office with steal syndrome. The patient's left arm AV fistula is working well, but their hand is numb and painful. To further evaluate this to determine what options would be possible to treat the steal syndrome, angiogram of the left upper extremity is indicated. Risks and benefits are discussed. Informed consent was obtained.  DESCRIPTION OF PROCEDURE: The patient was brought to the vascular suite. Moderate conscious sedation was administered during a face to face encounter with the patient throughout the procedure with my supervision of the RN administering medicines and monitoring the patient's vital signs, pulse oximetry, telemetry and mental status throughout from the start of the procedure until the patient was taken to the recovery room.  Groins were shaved and prepped and sterile surgical field was created. The right femoral head was localized with fluoroscopy and the right femoral artery was then visualized with ultrasound and found to be widely  patent. It was then accessed under direct ultrasound guidance without difficulty with a Seldinger needle and a permanent image was recorded. A J-wire and 5-French sheath were then placed. Pigtail catheter was placed into the ascending aorta and a thoracic aortogram was then performed in the LAO projection. This demonstrated normal origins to the great vessels without significant proximal stenoses and a normal configuration of the great vessels. The patient was given 4000 units of intravenous heparin and a headhunter catheter was used to selectively cannulate the left subclavian artery without difficulty. This was then sequentially advanced into the mid upper arm where imaging showed a large ulnar artery which was patent into the hand with a normal palmar arch and then retrograde flow through the radial artery which then went and filled the AV fistula relatively quickly.  Even though we were in the mid to distal upper arm, we had cannulated the ulnar artery and performed imaging through the ulnar artery.  At his original surgery, it was assumed that this was a brachiobasilic AV fistula but it was actually a radial basilic AV fistula.  This was secondary to a high bifurcation of the brachial artery.  The ulnar artery was large and patent without focal stenosis.  I then pulled the catheter back all the way to what was the subclavian artery to find the bifurcation of the brachial artery.  This was actually proximal to the humeral head and would be anatomically in the axillary artery.  I then cannulated the radial artery and advanced this into the mid to distal upper arm.  Imaging showed all of the flow through the radial artery draining out the AV fistula.  There was some narrowing of the radial basilic anastomosis although it did not appear high-grade. At  this point, no intervention was required today.  If his steal worsens, we can ligate the radial artery just distal to the anastomosis at the time of his  transposition which is still required to make the fistula fully functional. The diagnostic catheter was removed. Oblique arteriogram was performed of the right femoral artery and StarClose closure device deployed in the usual fashion with excellent hemostatic result. The patient tolerated the procedure well and was taken to the recovery room in stable condition.   Leotis Pain 04/20/2020 1:31 PM

## 2020-04-20 NOTE — ED Triage Notes (Signed)
Pt to ED reporting he had a stent placed in his arm today through his right groin. Pt was told if it started bleeding to come to the ED. Wound dressing has blood noted upon arrival but does not apperat o be saturated in bright red blood. No leaking from bandage. Bandage removed in triage and no arterial bleeding noted. Pt denies increased pain and no swelling or discoloration noted to groin or thigh

## 2020-04-20 NOTE — Interval H&P Note (Signed)
History and Physical Interval Note:  04/20/2020 12:30 PM  Dominic Henry  has presented today for surgery, with the diagnosis of LT Upper Extremity Angiography   Steal Syndrome    Pt to have Covid test on 04-19-20.  The various methods of treatment have been discussed with the patient and family. After consideration of risks, benefits and other options for treatment, the patient has consented to  Procedure(s): UPPER EXTREMITY ANGIOGRAPHY (Left) as a surgical intervention.  The patient's history has been reviewed, patient examined, no change in status, stable for surgery.  I have reviewed the patient's chart and labs.  Questions were answered to the patient's satisfaction.     Leotis Pain

## 2020-04-20 NOTE — H&P (View-Only) (Signed)
Subjective:    Patient ID: Dominic Henry, male    DOB: Oct 02, 1975, 44 y.o.   MRN: [id number] Chief Complaint  Patient presents with  . Follow-up    5 wk [facility] hda     Patient presents today for first look of his left brachiobasilic AV fistula creation.  His AV fistula was created on 03/09/2020.  Initially a brachiocephalic fistula was planned however there was visualization of the cephalic vein it was decided that a brachiobasilic AV fistula creation would be more appropriate.  The patient notes that there is no oozing or redness around the actual incision area however he has been having some numbness in his forearm.  He also notes that his hand gets cold at times in addition to having numbness and tingling.  He denies any open wounds or ulcerations.  The symptoms are not persistent but they do happen frequently.  Patient has not yet begun dialysis.  Today noninvasive studies show a flow volume of 916.  There is retrograde flow noted in the left radial artery.  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   Review of Systems  Skin: Positive for wound.  Neurological: Positive for numbness.  All other systems reviewed and are negative.      Objective:   Physical Exam Vitals reviewed.  HENT:     Head: Normocephalic.  Cardiovascular:     Rate and Rhythm: Normal rate.     Pulses:          Radial pulses are 0 on the left side.     Arteriovenous access: left arteriovenous access is present.    Comments: Good thrill and bruit Pulmonary:     Effort: Pulmonary effort is normal.  Neurological:     Mental Status: He is alert and oriented to person, place, and time.  Psychiatric:        Mood and Affect: Mood normal.        Behavior: Behavior normal.        Thought Content: Thought content normal.        Judgment: Judgment normal.     BP 136/86   Pulse 78   Ht 5\' 9"  (1.753 m)   Wt 256 lb (116.1 kg)   BMI 37.80 kg/m   Past Medical History:  Diagnosis Date  . CKD  (chronic kidney disease) stage 4, GFR 15-29 ml/min (HCC) 12/16/2016  . CKD (chronic kidney disease), stage III ([place]) 12/16/2016  . Hypertension 05/21/2013  . Stroke [facility]) 05/21/2014    Social History   Socioeconomic History  . Marital status: Married    Spouse name: Not on file  . Number of children: Not on file  . Years of education: Not on file  . Highest education level: Not on file  Occupational History  . Not on file  Tobacco Use  . Smoking status: Never Smoker  . Smokeless tobacco: Never Used  Vaping Use  . Vaping Use: Never used  Substance and Sexual Activity  . Alcohol use: No  . Drug use: No  . Sexual activity: Not on file  Other Topics Concern  . Not on file  Social History Narrative  . Not on file   Social Determinants of Health   Financial Resource Strain:   . Difficulty of Paying Living Expenses: Not on file  Food Insecurity:   . Worried About Charity fundraiser in the Last Year: Not on file  . Ran Out of Food in the Last Year: Not on  file  Transportation Needs:   . Film/video editor (Medical): Not on file  . Lack of Transportation (Non-Medical): Not on file  Physical Activity:   . Days of Exercise per Week: Not on file  . Minutes of Exercise per Session: Not on file  Stress:   . Feeling of Stress : Not on file  Social Connections:   . Frequency of Communication with Friends and Family: Not on file  . Frequency of Social Gatherings with Friends and Family: Not on file  . Attends Religious Services: Not on file  . Active Member of Clubs or Organizations: Not on file  . Attends Archivist Meetings: Not on file  . Marital Status: Not on file  Intimate Partner Violence:   . Fear of Current or Ex-Partner: Not on file  . Emotionally Abused: Not on file  . Physically Abused: Not on file  . Sexually Abused: Not on file    Past Surgical History:  Procedure Laterality Date  . AV FISTULA PLACEMENT Left 03/09/2020   Procedure: ARTERIOVENOUS  (AV) FISTULA CREATION (BRACHIALBASILIC);  Surgeon: Algernon Huxley, MD;  Location: [facility] ORS;  Service: Vascular;  Laterality: Left;    Family History  Problem Relation Age of Onset  . Hypertension Mother   . Diabetes Mother   . Hypertension Father     No Known Allergies  CBC Latest Ref Rng & Units 03/09/2020 12/12/2018 01/01/2018  WBC 4.0 - 10.5 K/uL 6.4 8.1 4.8  Hemoglobin 13.0 - 17.0 g/dL 10.8(L) 12.8(L) 10.2(L)  Hematocrit 39 - 52 % 34.0(L) 41.5 30.9(L)  Platelets 150 - 400 K/uL 315 328 272      CMP     Component Value Date/Time   NA 139 03/09/2020 1155   K 5.0 03/09/2020 1155   CL 108 03/09/2020 1155   CO2 19 (L) 03/09/2020 1155   GLUCOSE 107 (H) 03/09/2020 1155   BUN 77 (H) 03/09/2020 1155   CREATININE 7.46 (H) 03/09/2020 1155   CALCIUM 9.1 03/09/2020 1155   CALCIUM 9.3 11/29/2017 1032   PROT 7.7 12/30/2017 1155   ALBUMIN 3.8 12/30/2017 1155   AST 27 12/30/2017 1155   ALT 46 (H) 12/30/2017 1155   ALKPHOS 85 12/30/2017 1155   BILITOT 0.6 12/30/2017 1155   GFRNONAA 8 (L) 03/09/2020 1155   GFRAA 23 (L) 12/12/2018 2339     No results found.     Assessment & Plan:   1. Chronic kidney disease, stage 5 (HCC) Recommend:  The patient is experiencing increasing problems with their dialysis access.  Patient should have a left upper extremity angiogram with the intention for intervention.  The intention for intervention is to restore appropriate flow and prevent thrombosis and possible loss of the access in addition to preventing worsening of steal syndrome.  Because the brachiobasilic requires a second surgery it is prudent to attempt to fix his steal syndrome before proceeding with the second stage.    The risks, benefits and alternative therapies were reviewed in detail with the patient.  All questions were answered.  The patient agrees to proceed with angio/intervention.      2. Primary hypertension Continue antihypertensive medications as already ordered, these  medications have been reviewed and there are no changes at this time.    No current facility-administered medications on file prior to visit.   Current Outpatient Medications on File Prior to Visit  Medication Sig Dispense Refill  . amLODipine (NORVASC) 10 MG tablet Take 10 mg by mouth daily.    Marland Kitchen  aspirin EC 81 MG tablet Take 1 tablet (81 mg total) by mouth daily. 150 tablet 2  . Cholecalciferol 50 MCG (2000 UT) TABS Take 2,000 Units by mouth daily.     Marland Kitchen HYDROcodone-acetaminophen (NORCO) 5-325 MG tablet Take 1 tablet by mouth every 6 (six) hours as needed for moderate pain. (Patient not taking: Reported on 04/20/2020) 30 tablet 0  . sodium bicarbonate 650 MG tablet Take 1,300 mg by mouth in the morning and at bedtime.     . torsemide (DEMADEX) 20 MG tablet Take 20 mg by mouth 2 (two) times daily.    Marland Kitchen amLODipine (NORVASC) 5 MG tablet Take 1 tablet (5 mg total) by mouth daily. For hypertension 30 tablet 1  . losartan (COZAAR) 25 MG tablet Take 25 mg by mouth daily. (Patient not taking: Reported on 03/09/2020)      There are no Patient Instructions on file for this visit. No follow-ups on file.   Kris Hartmann, NP

## 2020-04-20 NOTE — Progress Notes (Signed)
   04/20/20 1300  Clinical Encounter Type  Visited With Family  Visit Type Initial  Referral From Chaplain  Consult/Referral To Chaplain  When chaplain was walking to the elevator and she stopped to see how Pt's wife and she said she had been waiting since 34 and had not heard anything. Chaplain asked wife to give her a minute and she would find out where the Pt is in the procedure. Once chaplain put her things in the suite, she came back and went to the cath lab. Chaplain asked the nurse and she said Pt was just taken back for procedure and they will notify wife once he is done. Wife was grateful for information and chaplain left.

## 2020-04-20 NOTE — H&P (View-Only) (Signed)
Subjective:    Patient ID: Dominic Henry, male    DOB: [birth year], 44 y.o.   MRN: [id number] Chief Complaint  Patient presents with  . Follow-up    5 wk [facility] hda     Patient presents today for first look of his left brachiobasilic AV fistula creation.  His AV fistula was created on 03/09/2020.  Initially a brachiocephalic fistula was planned however there was visualization of the cephalic vein it was decided that a brachiobasilic AV fistula creation would be more appropriate.  The patient notes that there is no oozing or redness around the actual incision area however he has been having some numbness in his forearm.  He also notes that his hand gets cold at times in addition to having numbness and tingling.  He denies any open wounds or ulcerations.  The symptoms are not persistent but they do happen frequently.  Patient has not yet begun dialysis.  Today noninvasive studies show a flow volume of 916.  There is retrograde flow noted in the left radial artery.  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   Review of Systems  Skin: Positive for wound.  Neurological: Positive for numbness.  All other systems reviewed and are negative.      Objective:   Physical Exam Vitals reviewed.  HENT:     Head: Normocephalic.  Cardiovascular:     Rate and Rhythm: Normal rate.     Pulses:          Radial pulses are 0 on the left side.     Arteriovenous access: left arteriovenous access is present.    Comments: Good thrill and bruit Pulmonary:     Effort: Pulmonary effort is normal.  Neurological:     Mental Status: He is alert and oriented to person, place, and time.  Psychiatric:        Mood and Affect: Mood normal.        Behavior: Behavior normal.        Thought Content: Thought content normal.        Judgment: Judgment normal.     BP 136/86   Pulse 78   Ht 5\' 9"  (1.753 m)   Wt 256 lb (116.1 kg)   BMI 37.80 kg/m   Past Medical History:  Diagnosis Date  . CKD  (chronic kidney disease) stage 4, GFR 15-29 ml/min (HCC) 12/16/2016  . CKD (chronic kidney disease), stage III ([place]) 12/16/2016  . Hypertension 05/21/2013  . Stroke [facility]) 05/21/2014    Social History   Socioeconomic History  . Marital status: Married    Spouse name: Not on file  . Number of children: Not on file  . Years of education: Not on file  . Highest education level: Not on file  Occupational History  . Not on file  Tobacco Use  . Smoking status: Never Smoker  . Smokeless tobacco: Never Used  Vaping Use  . Vaping Use: Never used  Substance and Sexual Activity  . Alcohol use: No  . Drug use: No  . Sexual activity: Not on file  Other Topics Concern  . Not on file  Social History Narrative  . Not on file   Social Determinants of Health   Financial Resource Strain:   . Difficulty of Paying Living Expenses: Not on file  Food Insecurity:   . Worried About Charity fundraiser in the Last Year: Not on file  . Ran Out of Food in the Last Year: Not on  file  Transportation Needs:   . Film/video editor (Medical): Not on file  . Lack of Transportation (Non-Medical): Not on file  Physical Activity:   . Days of Exercise per Week: Not on file  . Minutes of Exercise per Session: Not on file  Stress:   . Feeling of Stress : Not on file  Social Connections:   . Frequency of Communication with Friends and Family: Not on file  . Frequency of Social Gatherings with Friends and Family: Not on file  . Attends Religious Services: Not on file  . Active Member of Clubs or Organizations: Not on file  . Attends Archivist Meetings: Not on file  . Marital Status: Not on file  Intimate Partner Violence:   . Fear of Current or Ex-Partner: Not on file  . Emotionally Abused: Not on file  . Physically Abused: Not on file  . Sexually Abused: Not on file    Past Surgical History:  Procedure Laterality Date  . AV FISTULA PLACEMENT Left 03/09/2020   Procedure: ARTERIOVENOUS  (AV) FISTULA CREATION (BRACHIALBASILIC);  Surgeon: Algernon Huxley, MD;  Location: [facility] ORS;  Service: Vascular;  Laterality: Left;    Family History  Problem Relation Age of Onset  . Hypertension Mother   . Diabetes Mother   . Hypertension Father     No Known Allergies  CBC Latest Ref Rng & Units 03/09/2020 12/12/2018 01/01/2018  WBC 4.0 - 10.5 K/uL 6.4 8.1 4.8  Hemoglobin 13.0 - 17.0 g/dL 10.8(L) 12.8(L) 10.2(L)  Hematocrit 39 - 52 % 34.0(L) 41.5 30.9(L)  Platelets 150 - 400 K/uL 315 328 272      CMP     Component Value Date/Time   NA 139 03/09/2020 1155   K 5.0 03/09/2020 1155   CL 108 03/09/2020 1155   CO2 19 (L) 03/09/2020 1155   GLUCOSE 107 (H) 03/09/2020 1155   BUN 77 (H) 03/09/2020 1155   CREATININE 7.46 (H) 03/09/2020 1155   CALCIUM 9.1 03/09/2020 1155   CALCIUM 9.3 11/29/2017 1032   PROT 7.7 12/30/2017 1155   ALBUMIN 3.8 12/30/2017 1155   AST 27 12/30/2017 1155   ALT 46 (H) 12/30/2017 1155   ALKPHOS 85 12/30/2017 1155   BILITOT 0.6 12/30/2017 1155   GFRNONAA 8 (L) 03/09/2020 1155   GFRAA 23 (L) 12/12/2018 2339     No results found.     Assessment & Plan:   1. Chronic kidney disease, stage 5 (HCC) Recommend:  The patient is experiencing increasing problems with their dialysis access.  Patient should have a left upper extremity angiogram with the intention for intervention.  The intention for intervention is to restore appropriate flow and prevent thrombosis and possible loss of the access in addition to preventing worsening of steal syndrome.  Because the brachiobasilic requires a second surgery it is prudent to attempt to fix his steal syndrome before proceeding with the second stage.    The risks, benefits and alternative therapies were reviewed in detail with the patient.  All questions were answered.  The patient agrees to proceed with angio/intervention.      2. Primary hypertension Continue antihypertensive medications as already ordered, these  medications have been reviewed and there are no changes at this time.    No current facility-administered medications on file prior to visit.   Current Outpatient Medications on File Prior to Visit  Medication Sig Dispense Refill  . amLODipine (NORVASC) 10 MG tablet Take 10 mg by mouth daily.    Marland Kitchen  aspirin EC 81 MG tablet Take 1 tablet (81 mg total) by mouth daily. 150 tablet 2  . Cholecalciferol 50 MCG (2000 UT) TABS Take 2,000 Units by mouth daily.     Marland Kitchen HYDROcodone-acetaminophen (NORCO) 5-325 MG tablet Take 1 tablet by mouth every 6 (six) hours as needed for moderate pain. (Patient not taking: Reported on 04/20/2020) 30 tablet 0  . sodium bicarbonate 650 MG tablet Take 1,300 mg by mouth in the morning and at bedtime.     . torsemide (DEMADEX) 20 MG tablet Take 20 mg by mouth 2 (two) times daily.    Marland Kitchen amLODipine (NORVASC) 5 MG tablet Take 1 tablet (5 mg total) by mouth daily. For hypertension 30 tablet 1  . losartan (COZAAR) 25 MG tablet Take 25 mg by mouth daily. (Patient not taking: Reported on 03/09/2020)      There are no Patient Instructions on file for this visit. No follow-ups on file.   Kris Hartmann, NP

## 2020-04-20 NOTE — Progress Notes (Signed)
Subjective:    Patient ID: Dominic Henry, male    DOB: 1976-04-09, 44 y.o.   MRN: [id number] Chief Complaint  Patient presents with  . Follow-up    5 wk [facility] hda     Patient presents today for first look of his left brachiobasilic AV fistula creation.  His AV fistula was created on 03/09/2020.  Initially a brachiocephalic fistula was planned however there was visualization of the cephalic vein it was decided that a brachiobasilic AV fistula creation would be more appropriate.  The patient notes that there is no oozing or redness around the actual incision area however he has been having some numbness in his forearm.  He also notes that his hand gets cold at times in addition to having numbness and tingling.  He denies any open wounds or ulcerations.  The symptoms are not persistent but they do happen frequently.  Patient has not yet begun dialysis.  Today noninvasive studies show a flow volume of 916.  There is retrograde flow noted in the left radial artery.  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   Review of Systems  Skin: Positive for wound.  Neurological: Positive for numbness.  All other systems reviewed and are negative.      Objective:   Physical Exam Vitals reviewed.  HENT:     Head: Normocephalic.  Cardiovascular:     Rate and Rhythm: Normal rate.     Pulses:          Radial pulses are 0 on the left side.     Arteriovenous access: left arteriovenous access is present.    Comments: Good thrill and bruit Pulmonary:     Effort: Pulmonary effort is normal.  Neurological:     Mental Status: He is alert and oriented to person, place, and time.  Psychiatric:        Mood and Affect: Mood normal.        Behavior: Behavior normal.        Thought Content: Thought content normal.        Judgment: Judgment normal.     BP 136/86   Pulse 78   Ht 5\' 9"  (1.753 m)   Wt 256 lb (116.1 kg)   BMI 37.80 kg/m   Past Medical History:  Diagnosis Date  . CKD  (chronic kidney disease) stage 4, GFR 15-29 ml/min (HCC) 12/16/2016  . CKD (chronic kidney disease), stage III ([place]) 12/16/2016  . Hypertension 05/21/2013  . Stroke [facility]) 05/21/2014    Social History   Socioeconomic History  . Marital status: Married    Spouse name: Not on file  . Number of children: Not on file  . Years of education: Not on file  . Highest education level: Not on file  Occupational History  . Not on file  Tobacco Use  . Smoking status: Never Smoker  . Smokeless tobacco: Never Used  Vaping Use  . Vaping Use: Never used  Substance and Sexual Activity  . Alcohol use: No  . Drug use: No  . Sexual activity: Not on file  Other Topics Concern  . Not on file  Social History Narrative  . Not on file   Social Determinants of Health   Financial Resource Strain:   . Difficulty of Paying Living Expenses: Not on file  Food Insecurity:   . Worried About Charity fundraiser in the Last Year: Not on file  . Ran Out of Food in the Last Year: Not on  file  Transportation Needs:   . Film/video editor (Medical): Not on file  . Lack of Transportation (Non-Medical): Not on file  Physical Activity:   . Days of Exercise per Week: Not on file  . Minutes of Exercise per Session: Not on file  Stress:   . Feeling of Stress : Not on file  Social Connections:   . Frequency of Communication with Friends and Family: Not on file  . Frequency of Social Gatherings with Friends and Family: Not on file  . Attends Religious Services: Not on file  . Active Member of Clubs or Organizations: Not on file  . Attends Archivist Meetings: Not on file  . Marital Status: Not on file  Intimate Partner Violence:   . Fear of Current or Ex-Partner: Not on file  . Emotionally Abused: Not on file  . Physically Abused: Not on file  . Sexually Abused: Not on file    Past Surgical History:  Procedure Laterality Date  . AV FISTULA PLACEMENT Left 03/09/2020   Procedure: ARTERIOVENOUS  (AV) FISTULA CREATION (BRACHIALBASILIC);  Surgeon: Algernon Huxley, MD;  Location: [facility] ORS;  Service: Vascular;  Laterality: Left;    Family History  Problem Relation Age of Onset  . Hypertension Mother   . Diabetes Mother   . Hypertension Father     No Known Allergies  CBC Latest Ref Rng & Units 03/09/2020 12/12/2018 01/01/2018  WBC 4.0 - 10.5 K/uL 6.4 8.1 4.8  Hemoglobin 13.0 - 17.0 g/dL 10.8(L) 12.8(L) 10.2(L)  Hematocrit 39 - 52 % 34.0(L) 41.5 30.9(L)  Platelets 150 - 400 K/uL 315 328 272      CMP     Component Value Date/Time   NA 139 03/09/2020 1155   K 5.0 03/09/2020 1155   CL 108 03/09/2020 1155   CO2 19 (L) 03/09/2020 1155   GLUCOSE 107 (H) 03/09/2020 1155   BUN 77 (H) 03/09/2020 1155   CREATININE 7.46 (H) 03/09/2020 1155   CALCIUM 9.1 03/09/2020 1155   CALCIUM 9.3 11/29/2017 1032   PROT 7.7 12/30/2017 1155   ALBUMIN 3.8 12/30/2017 1155   AST 27 12/30/2017 1155   ALT 46 (H) 12/30/2017 1155   ALKPHOS 85 12/30/2017 1155   BILITOT 0.6 12/30/2017 1155   GFRNONAA 8 (L) 03/09/2020 1155   GFRAA 23 (L) 12/12/2018 2339     No results found.     Assessment & Plan:   1. Chronic kidney disease, stage 5 (HCC) Recommend:  The patient is experiencing increasing problems with their dialysis access.  Patient should have a left upper extremity angiogram with the intention for intervention.  The intention for intervention is to restore appropriate flow and prevent thrombosis and possible loss of the access in addition to preventing worsening of steal syndrome.  Because the brachiobasilic requires a second surgery it is prudent to attempt to fix his steal syndrome before proceeding with the second stage.    The risks, benefits and alternative therapies were reviewed in detail with the patient.  All questions were answered.  The patient agrees to proceed with angio/intervention.      2. Primary hypertension Continue antihypertensive medications as already ordered, these  medications have been reviewed and there are no changes at this time.    No current facility-administered medications on file prior to visit.   Current Outpatient Medications on File Prior to Visit  Medication Sig Dispense Refill  . amLODipine (NORVASC) 10 MG tablet Take 10 mg by mouth daily.    Marland Kitchen  aspirin EC 81 MG tablet Take 1 tablet (81 mg total) by mouth daily. 150 tablet 2  . Cholecalciferol 50 MCG (2000 UT) TABS Take 2,000 Units by mouth daily.     Marland Kitchen HYDROcodone-acetaminophen (NORCO) 5-325 MG tablet Take 1 tablet by mouth every 6 (six) hours as needed for moderate pain. (Patient not taking: Reported on 04/20/2020) 30 tablet 0  . sodium bicarbonate 650 MG tablet Take 1,300 mg by mouth in the morning and at bedtime.     . torsemide (DEMADEX) 20 MG tablet Take 20 mg by mouth 2 (two) times daily.    Marland Kitchen amLODipine (NORVASC) 5 MG tablet Take 1 tablet (5 mg total) by mouth daily. For hypertension 30 tablet 1  . losartan (COZAAR) 25 MG tablet Take 25 mg by mouth daily. (Patient not taking: Reported on 03/09/2020)      There are no Patient Instructions on file for this visit. No follow-ups on file.   Kris Hartmann, NP

## 2020-04-20 NOTE — ED Notes (Signed)
Attempted to call pt, no response.

## 2020-04-21 ENCOUNTER — Encounter: Payer: Self-pay | Admitting: Vascular Surgery

## 2020-04-21 ENCOUNTER — Emergency Department
Admission: EM | Admit: 2020-04-21 | Discharge: 2020-04-21 | Disposition: A | Discharge status: Home / Self Care | Payer: Medicaid Other | Attending: Emergency Medicine | Admitting: Emergency Medicine

## 2020-04-21 NOTE — ED Notes (Signed)
No answer when called several times from lobby 

## 2020-04-22 ENCOUNTER — Telehealth (INDEPENDENT_AMBULATORY_CARE_PROVIDER_SITE_OTHER): Payer: Self-pay

## 2020-04-22 ENCOUNTER — Other Ambulatory Visit: Payer: Self-pay

## 2020-04-22 ENCOUNTER — Other Ambulatory Visit
Admission: RE | Admit: 2020-04-22 | Discharge: 2020-04-22 | Disposition: A | Discharge status: Home / Self Care | Payer: Medicaid Other | Source: Ambulatory Visit | Attending: Orthopedic Surgery | Admitting: Orthopedic Surgery

## 2020-04-22 DIAGNOSIS — Z01812 Encounter for preprocedural laboratory examination: Secondary | ICD-10-CM | POA: Insufficient documentation

## 2020-04-22 DIAGNOSIS — Z20822 Contact with and (suspected) exposure to covid-19: Secondary | ICD-10-CM | POA: Insufficient documentation

## 2020-04-22 NOTE — Telephone Encounter (Signed)
Spoke with the patient's wife on 04/21/20 and gave her the information regarding the patient's surgery on 04/28/20 with Dr. Lucky Cowboy at the [facility]. Phone call pre-op is scheduled for 04/26/20 between 8-1 pm and covid testing on 04/27/20 between 8-1 pm. Pre-surgical instructions were discussed. I received this morning a order for a permcath insertion for this patient. Patient has been scheduled with Dr. Lucky Cowboy for a permcath insertion on 04/25/20 with a 9:00 am arrival time to the [facility]. Covid testing today before 11:00 am at the [facility]. Pre-procedure instructions were discussed.

## 2020-04-23 LAB — SARS CORONAVIRUS 2 (TAT 6-24 HRS): SARS Coronavirus 2: NEGATIVE

## 2020-04-25 ENCOUNTER — Emergency Department
Admission: EM | Admit: 2020-04-25 | Discharge: 2020-04-25 | Disposition: A | Discharge status: Home / Self Care | Payer: Medicaid Other | Attending: Emergency Medicine | Admitting: Emergency Medicine

## 2020-04-25 ENCOUNTER — Other Ambulatory Visit: Payer: Self-pay

## 2020-04-25 ENCOUNTER — Ambulatory Visit
Admission: RE | Admit: 2020-04-25 | Discharge: 2020-04-25 | Disposition: A | Discharge status: Home / Self Care | Payer: Medicaid Other | Attending: Vascular Surgery | Admitting: Vascular Surgery

## 2020-04-25 ENCOUNTER — Encounter: Payer: Self-pay | Admitting: Emergency Medicine

## 2020-04-25 ENCOUNTER — Encounter: Payer: Self-pay | Admitting: Vascular Surgery

## 2020-04-25 ENCOUNTER — Encounter
Admission: RE | Disposition: A | Discharge status: Home / Self Care | Payer: Self-pay | Source: Home / Self Care | Attending: Vascular Surgery

## 2020-04-25 ENCOUNTER — Other Ambulatory Visit (INDEPENDENT_AMBULATORY_CARE_PROVIDER_SITE_OTHER): Payer: Self-pay | Admitting: Nurse Practitioner

## 2020-04-25 DIAGNOSIS — Z8673 Personal history of transient ischemic attack (TIA), and cerebral infarction without residual deficits: Secondary | ICD-10-CM | POA: Insufficient documentation

## 2020-04-25 DIAGNOSIS — N186 End stage renal disease: Secondary | ICD-10-CM | POA: Diagnosis present

## 2020-04-25 DIAGNOSIS — I120 Hypertensive chronic kidney disease with stage 5 chronic kidney disease or end stage renal disease: Secondary | ICD-10-CM | POA: Insufficient documentation

## 2020-04-25 DIAGNOSIS — Z8249 Family history of ischemic heart disease and other diseases of the circulatory system: Secondary | ICD-10-CM | POA: Diagnosis not present

## 2020-04-25 DIAGNOSIS — Z452 Encounter for adjustment and management of vascular access device: Secondary | ICD-10-CM | POA: Diagnosis present

## 2020-04-25 DIAGNOSIS — Z7982 Long term (current) use of aspirin: Secondary | ICD-10-CM | POA: Diagnosis not present

## 2020-04-25 DIAGNOSIS — Z95828 Presence of other vascular implants and grafts: Secondary | ICD-10-CM | POA: Insufficient documentation

## 2020-04-25 DIAGNOSIS — Z79899 Other long term (current) drug therapy: Secondary | ICD-10-CM | POA: Diagnosis not present

## 2020-04-25 DIAGNOSIS — N185 Chronic kidney disease, stage 5: Secondary | ICD-10-CM | POA: Insufficient documentation

## 2020-04-25 HISTORY — PX: DIALYSIS/PERMA CATHETER INSERTION: CATH118288

## 2020-04-25 LAB — CBC WITH DIFFERENTIAL/PLATELET
Abs Immature Granulocytes: 0.02 10*3/uL (ref 0.00–0.07)
Basophils Absolute: 0.1 10*3/uL (ref 0.0–0.1)
Basophils Relative: 1 %
Eosinophils Absolute: 0.4 10*3/uL (ref 0.0–0.5)
Eosinophils Relative: 4 %
HCT: 31 % — ABNORMAL LOW (ref 39.0–52.0)
Hemoglobin: 9.7 g/dL — ABNORMAL LOW (ref 13.0–17.0)
Immature Granulocytes: 0 %
Lymphocytes Relative: 16 %
Lymphs Abs: 1.3 10*3/uL (ref 0.7–4.0)
MCH: 25.9 pg — ABNORMAL LOW (ref 26.0–34.0)
MCHC: 31.3 g/dL (ref 30.0–36.0)
MCV: 82.9 fL (ref 80.0–100.0)
Monocytes Absolute: 0.5 10*3/uL (ref 0.1–1.0)
Monocytes Relative: 6 %
Neutro Abs: 5.8 10*3/uL (ref 1.7–7.7)
Neutrophils Relative %: 73 %
Platelets: 285 10*3/uL (ref 150–400)
RBC: 3.74 MIL/uL — ABNORMAL LOW (ref 4.22–5.81)
RDW: 12.4 % (ref 11.5–15.5)
WBC: 8 10*3/uL (ref 4.0–10.5)
nRBC: 0 % (ref 0.0–0.2)

## 2020-04-25 LAB — POTASSIUM (ARMC VASCULAR LAB ONLY): Potassium (ARMC vascular lab): 4.3 (ref 3.5–5.1)

## 2020-04-25 SURGERY — DIALYSIS/PERMA CATHETER INSERTION
Anesthesia: Moderate Sedation

## 2020-04-25 MED ORDER — TRANEXAMIC ACID FOR EPISTAXIS
500.0000 mg | Freq: Once | TOPICAL | Status: AC
  Administered 2020-04-25: 500 mg via TOPICAL
  Filled 2020-04-25: qty 10

## 2020-04-25 MED ORDER — MIDAZOLAM HCL 5 MG/5ML IJ SOLN
INTRAMUSCULAR | Status: AC
  Filled 2020-04-25: qty 5

## 2020-04-25 MED ORDER — MIDAZOLAM HCL 2 MG/ML PO SYRP: 8.0000 mg | ORAL_SOLUTION | Freq: Once | ORAL | Status: DC | PRN

## 2020-04-25 MED ORDER — MIDAZOLAM HCL 2 MG/2ML IJ SOLN
INTRAMUSCULAR | Status: DC | PRN
  Administered 2020-04-25: 2 mg via INTRAVENOUS

## 2020-04-25 MED ORDER — FENTANYL CITRATE (PF) 100 MCG/2ML IJ SOLN
INTRAMUSCULAR | Status: DC | PRN
  Administered 2020-04-25: 50 ug via INTRAVENOUS

## 2020-04-25 MED ORDER — FENTANYL CITRATE (PF) 100 MCG/2ML IJ SOLN
INTRAMUSCULAR | Status: AC
  Filled 2020-04-25: qty 2

## 2020-04-25 MED ORDER — HEPARIN SODIUM (PORCINE) 5000 UNIT/ML IJ SOLN
INTRAMUSCULAR | Status: AC
  Filled 2020-04-25: qty 2

## 2020-04-25 MED ORDER — CEFAZOLIN SODIUM-DEXTROSE 1-4 GM/50ML-% IV SOLN
INTRAVENOUS | Status: AC
  Filled 2020-04-25: qty 50

## 2020-04-25 MED ORDER — METHYLPREDNISOLONE SODIUM SUCC 125 MG IJ SOLR: 125.0000 mg | Freq: Once | INTRAMUSCULAR | Status: DC | PRN

## 2020-04-25 MED ORDER — CEFAZOLIN SODIUM-DEXTROSE 1-4 GM/50ML-% IV SOLN
1.0000 g | Freq: Once | INTRAVENOUS | Status: AC
  Administered 2020-04-25: 1 g via INTRAVENOUS

## 2020-04-25 MED ORDER — HYDROMORPHONE HCL 1 MG/ML IJ SOLN: 1.0000 mg | Freq: Once | INTRAMUSCULAR | Status: DC | PRN

## 2020-04-25 MED ORDER — ONDANSETRON HCL 4 MG/2ML IJ SOLN: 4.0000 mg | Freq: Four times a day (QID) | INTRAMUSCULAR | Status: DC | PRN

## 2020-04-25 MED ORDER — FAMOTIDINE 20 MG PO TABS: 40.0000 mg | ORAL_TABLET | Freq: Once | ORAL | Status: DC | PRN

## 2020-04-25 MED ORDER — DIPHENHYDRAMINE HCL 50 MG/ML IJ SOLN: 50.0000 mg | Freq: Once | INTRAMUSCULAR | Status: DC | PRN

## 2020-04-25 MED ORDER — SODIUM CHLORIDE 0.9 % IV SOLN: INTRAVENOUS | Status: DC

## 2020-04-25 SURGICAL SUPPLY — 7 items
ADH SKN CLS APL DERMABOND .7 (GAUZE/BANDAGES/DRESSINGS) ×1
CATH PALIN MAXID VT KIT 19CM (CATHETERS) ×2 IMPLANT
DERMABOND ADVANCED (GAUZE/BANDAGES/DRESSINGS) ×1
DERMABOND ADVANCED .7 DNX12 (GAUZE/BANDAGES/DRESSINGS) ×1 IMPLANT
PACK ANGIOGRAPHY (CUSTOM PROCEDURE TRAY) ×2 IMPLANT
SUT MNCRL AB 4-0 PS2 18 (SUTURE) ×2 IMPLANT
SUT PROLENE 0 CT 1 30 (SUTURE) ×2 IMPLANT

## 2020-04-25 NOTE — Interval H&P Note (Signed)
History and Physical Interval Note:  04/25/2020 8:58 AM  Dominic Henry  has presented today for surgery, with the diagnosis of Perm Cath Insertion   ESRD   Pt to have Covid test on 12-3.  The various methods of treatment have been discussed with the patient and family. After consideration of risks, benefits and other options for treatment, the patient has consented to  Procedure(s): DIALYSIS/PERMA CATHETER INSERTION (N/A) as a surgical intervention.  The patient's history has been reviewed, patient examined, no change in status, stable for surgery.  I have reviewed the patient's chart and labs.  Questions were answered to the patient's satisfaction.     Leotis Pain

## 2020-04-25 NOTE — Op Note (Signed)
OPERATIVE NOTE    PRE-OPERATIVE DIAGNOSIS: 1. ESRD   POST-OPERATIVE DIAGNOSIS: same as above  PROCEDURE: 1. Ultrasound guidance for vascular access to the right internal jugular vein 2. Fluoroscopic guidance for placement of catheter 3. Placement of a 19 cm tip to cuff tunneled hemodialysis catheter via the right internal jugular vein  SURGEON: Leotis Pain, MD  ANESTHESIA:  Local with Moderate conscious sedation for approximately 22 minutes using 2 mg of Versed and 50 mcg of Fentanyl  ESTIMATED BLOOD LOSS: 5 cc  FLUORO TIME: less than one minute  CONTRAST: none  FINDING(S): 1.  Patent right internal jugular vein  SPECIMEN(S):  None  INDICATIONS:   Mahkai Fangman is a 44 y.o.male who presents with renal failure and a fistula that is not ready to be used.  The patient needs long term dialysis access for their ESRD, and a Permcath is necessary.  Risks and benefits are discussed and informed consent is obtained.    DESCRIPTION: After obtaining full informed written consent, the patient was brought back to the vascular suited. The patient's right neck and chest were sterilely prepped and draped in a sterile surgical field was created. Moderate conscious sedation was administered during a face to face encounter with the patient throughout the procedure with my supervision of the RN administering medicines and monitoring the patient's vital signs, pulse oximetry, telemetry and mental status throughout from the start of the procedure until the patient was taken to the recovery room.  The right internal jugular vein was visualized with ultrasound and found to be patent. It was then accessed under direct ultrasound guidance and a permanent image was recorded. A wire was placed. After skin nick and dilatation, the peel-away sheath was placed over the wire. I then turned my attention to an area under the clavicle. Approximately 1-2 fingerbreadths below the clavicle a small counterincision  was created and tunneled from the subclavicular incision to the access site. Using fluoroscopic guidance, a 19 centimeter tip to cuff tunneled hemodialysis catheter was selected, and tunneled from the subclavicular incision to the access site. It was then placed through the peel-away sheath and the peel-away sheath was removed. Using fluoroscopic guidance the catheter tips were parked in the right atrium. The appropriate distal connectors were placed. It withdrew blood well and flushed easily with heparinized saline and a concentrated heparin solution was then placed. It was secured to the chest wall with 2 Prolene sutures. The access incision was closed single 4-0 Monocryl. A 4-0 Monocryl pursestring suture was placed around the exit site. Sterile dressings were placed. The patient tolerated the procedure well and was taken to the recovery room in stable condition.  COMPLICATIONS: None  CONDITION: Stable  Leotis Pain, MD 04/25/2020 10:33 AM   This note was created with Dragon Medical transcription system. Any errors in dictation are purely unintentional.

## 2020-04-25 NOTE — ED Notes (Signed)
Assumed care of pt upon being roomed, port site bleeding pt was applying pressure with guaze. Hemostatic dressing applied and transparent dressing placed on top to seal in topical treatment. Slight bleeding through dressing noted. Dressing applied at 2100. Pt reports tenderness to area (R upper chest). Wife updated via phone of plan of care. Awaiting lab results. Pt AO x4. Side rails up x2

## 2020-04-25 NOTE — ED Notes (Signed)
Pt resting in litter without complaints, provided with water

## 2020-04-25 NOTE — ED Triage Notes (Signed)
Pt via POV from home. Pt states around 3:40pm pt woke up out of his sleep and noticed his hemodialysis cath was bleeding. Pt just got it placed today. Pt is A&Ox4 and NAD. Blood has soaked through the gauze and shirt. Pt states it is painful

## 2020-04-25 NOTE — ED Notes (Signed)
DC reviewed, pt ambulated out of ED with steady gait, wife picked pt up

## 2020-04-25 NOTE — ED Provider Notes (Signed)
[facility] Emergency Department Provider Note  ____________________________________________   First MD Initiated Contact with Patient 04/25/20 2010     (approximate)  I have reviewed the triage vital signs and the nursing notes.   HISTORY  Chief Complaint Wound Check   HPI Dominic Henry is a 44 y.o. male with past medical history of HTN, CVA, and CKD with placement of tunneled cath earlier today in preparation for initiation of HD who presents for assessment of some persistent oozing from insertion site.  Patient states he has a little bit of pain at the insertion site but denies any acute symptoms including headache, earache, sore throat, fevers, cough, other chest pain, shortness of breath, abdominal pain, vomiting, diarrhea, dysuria, rash or any other acute symptoms.  He denies being on any blood thinners.  States he he has bled through about 2 bandages and that it soaked into his T-shirt before getting to the ED.  No prior similar episodes or known coagulopathy.  No recent injuries or falls.  No other acute concerns at this time.          Past Medical History:  Diagnosis Date  . CKD (chronic kidney disease) stage 4, GFR 15-29 ml/min (HCC) 12/16/2016  . CKD (chronic kidney disease), stage III ([place]) 12/16/2016  . Hypertension 05/21/2013  . Stroke [facility]) 05/21/2014    Patient Active Problem List   Diagnosis Date Noted  . HTN (hypertension) 03/01/2020  . 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 Chronic kidney disease, stage V ([place]) 02/16/2020  . 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 Hyperkalemia 10/13/2019  . Obesity 10/13/2019  . Chronic kidney disease due to hypertension 10/13/2019  . 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 Proteinuria 12/31/2017  . Vitamin B12 deficiency 12/15/2016  . Normocytic anemia 12/14/2016  . CAP (community acquired pneumonia) 12/13/2016  . AKI (acute kidney  injury) ([place]) 12/13/2016  . Essential hypertension 12/13/2016  . Acute kidney failure, unspecified ([place]) 12/13/2016  . Community acquired pneumonia 12/13/2016    Past Surgical History:  Procedure Laterality Date  . AV FISTULA PLACEMENT Left 03/09/2020   Procedure: ARTERIOVENOUS (AV) FISTULA CREATION (BRACHIALBASILIC);  Surgeon: Algernon Huxley, MD;  Location: [facility] ORS;  Service: Vascular;  Laterality: Left;  . DIALYSIS/PERMA CATHETER INSERTION N/A 04/25/2020   Procedure: DIALYSIS/PERMA CATHETER INSERTION;  Surgeon: Algernon Huxley, MD;  Location: [place] CV LAB;  Service: Cardiovascular;  Laterality: N/A;  . UPPER EXTREMITY ANGIOGRAPHY Left 04/20/2020   Procedure: UPPER EXTREMITY ANGIOGRAPHY;  Surgeon: Algernon Huxley, MD;  Location: [place] CV LAB;  Service: Cardiovascular;  Laterality: Left;    Prior to Admission medications   Medication Sig Start Date End Date Taking? Authorizing Provider  amLODipine (NORVASC) 10 MG tablet Take 10 mg by mouth daily. 02/17/20   [provider]  amLODipine (NORVASC) 5 MG tablet Take 1 tablet (5 mg total) by mouth daily. For hypertension 12/17/16   Rexene Alberts, MD  aspirin EC 81 MG tablet Take 1 tablet (81 mg total) by mouth daily. 03/09/20   Algernon Huxley, MD  Cholecalciferol 50 MCG (2000 UT) TABS Take 2,000 Units by mouth daily.  05/12/19 05/11/20  [provider]  HYDROcodone-acetaminophen (NORCO) 5-325 MG tablet Take 1 tablet by mouth every 6 (six) hours as needed for moderate pain. Patient not taking: Reported on 04/20/2020 03/09/20   Algernon Huxley, MD  losartan (COZAAR) 25 MG tablet Take 25 mg by mouth daily. Patient not taking: Reported  on 03/09/2020 12/18/17   [provider]  sodium bicarbonate 650 MG tablet Take 1,300 mg by mouth in the morning and at bedtime.  05/12/19   [provider]  torsemide (DEMADEX) 20 MG tablet Take 20 mg by mouth 2 (two) times daily. 03/30/20   [provider]     Allergies Patient has no known allergies.  Family History  Problem Relation Age of Onset  . Hypertension Mother   . Diabetes Mother   . Hypertension Father     Social History Social History   Tobacco Use  . Smoking status: Never Smoker  . Smokeless tobacco: Never Used  Vaping Use  . Vaping Use: Never used  Substance Use Topics  . Alcohol use: No  . Drug use: No    Review of Systems  Review of Systems  Constitutional: Negative for chills and fever.  HENT: Negative for sore throat.   Eyes: Negative for pain.  Respiratory: Negative for cough and stridor.   Cardiovascular: Negative for chest pain.  Gastrointestinal: Negative for vomiting.  Genitourinary: Negative for dysuria.  Musculoskeletal: Positive for myalgias ( myalgias at catheter insertion site).  Skin: Negative for rash.  Neurological: Negative for seizures, loss of consciousness and headaches.  Psychiatric/Behavioral: Negative for suicidal ideas.  All other systems reviewed and are negative.     ____________________________________________   PHYSICAL EXAM:  VITAL SIGNS: ED Triage Vitals [04/25/20 1853]  Enc Vitals Group     BP (!) 155/82     Pulse Rate 93     Resp 20     Temp 98.8 F (37.1 C)     Temp Source Oral     SpO2 100 %     Weight 250 lb (113.4 kg)     Height 5\' 9"  (1.753 m)     Head Circumference      Peak Flow      Pain Score 9     Pain Loc      Pain Edu?      Excl. in [place]?    Vitals:   04/25/20 2028 04/25/20 2200  BP: (!) 152/95 (!) 143/89  Pulse: 87 75  Resp: 15 16  Temp:    SpO2: 97% 98%   Physical Exam Vitals and nursing note reviewed.  Constitutional:      Appearance: He is well-developed.  HENT:     Head: Normocephalic and atraumatic.     Right Ear: External ear normal.     Left Ear: External ear normal.     Nose: Nose normal.  Eyes:     Conjunctiva/sclera: Conjunctivae normal.  Cardiovascular:     Rate and Rhythm: Normal rate and regular rhythm.      Heart sounds: No murmur heard.   Pulmonary:     Effort: Pulmonary effort is normal. No respiratory distress.     Breath sounds: Normal breath sounds.  Abdominal:     Palpations: Abdomen is soft.     Tenderness: There is no abdominal tenderness.  Musculoskeletal:     Cervical back: Neck supple.  Skin:    General: Skin is warm and dry.     Capillary Refill: Capillary refill takes less than 2 seconds.  Neurological:     Mental Status: He is alert and oriented to person, place, and time.  Psychiatric:        Mood and Affect: Mood normal.     Tunnel catheter is in place with some oozing at the insertion site.  No other surrounding  skin changes.  Catheter appears appropriate and position and sutures are in appropriate position.  No redness induration swelling or other skin changes.  ____________________________________________   LABS (all labs ordered are listed, but only abnormal results are displayed)  Labs Reviewed  CBC WITH DIFFERENTIAL/PLATELET - Abnormal; Notable for the following components:      Result Value   RBC 3.74 (*)    Hemoglobin 9.7 (*)    HCT 31.0 (*)    MCH 25.9 (*)    All other components within normal limits   ____________________________________________  ____________________________________________   PROCEDURES  Procedure(s) performed (including Critical Care):  Procedures   ____________________________________________   INITIAL IMPRESSION / ASSESSMENT AND PLAN / ED COURSE      Patient presents for assessment of some bleeding around his right-sided tunneled cath was placed earlier today.  On arrival he is afebrile and hemodynamically stable.  Hemoglobin is close to baseline at 9.7 compared to 10.58-month ago and platelets are within normal notes.  Topical TXA and Surgicel were placed and patient had no recurrence of bleeding after for approximately 5 hours of observation time.  Given stable vital signs, stable hemoglobin, and no recurrence of  bleeding is believe he is safe for discharge plan close outpatient PCP follow-up.  No findings on history exam to suggest acute infectious process, dislodgment, injury, or other me life-threatening process.  Discharged stable condition.  Return precautions advised and discussed.   ____________________________________________   FINAL CLINICAL IMPRESSION(S) / ED DIAGNOSES  Final diagnoses:  Encounter for central line care    Medications  tranexamic acid (CYKLOKAPRON) 1000 MG/10ML topical solution 500 mg (500 mg Topical Given 04/25/20 2100)     ED Discharge Orders    None       Note:  This document was prepared using Dragon voice recognition software and may include unintentional dictation errors.   Lucrezia Starch, MD 04/25/20 [phone]

## 2020-04-26 ENCOUNTER — Other Ambulatory Visit (INDEPENDENT_AMBULATORY_CARE_PROVIDER_SITE_OTHER): Payer: Self-pay | Admitting: Nurse Practitioner

## 2020-04-26 ENCOUNTER — Other Ambulatory Visit
Admission: RE | Admit: 2020-04-26 | Discharge: 2020-04-26 | Disposition: A | Discharge status: Home / Self Care | Payer: Medicaid Other | Source: Ambulatory Visit | Attending: Vascular Surgery | Admitting: Vascular Surgery

## 2020-04-26 ENCOUNTER — Telehealth (INDEPENDENT_AMBULATORY_CARE_PROVIDER_SITE_OTHER): Payer: Self-pay

## 2020-04-26 NOTE — Patient Instructions (Signed)
Your procedure is scheduled on: Thursday April 28, 2020. Report to Day Surgery inside [facility] 2nd floor (stop by Registration desk first). To find out your arrival time please call [phone] between 1PM - 3PM on Wednesday April 27, 2020.  Remember: Instructions that are not followed completely may result in serious medical risk,  up to and including death, or upon the discretion of your surgeon and anesthesiologist your  surgery may need to be rescheduled.     _X__ 1. Do not eat food after midnight the night before your procedure.                 No chewing gum or hard candies. You may drink clear liquids up to 2 hours                 before you are scheduled to arrive for your surgery- DO not drink clear                 liquids within 2 hours of the start of your surgery.                 Clear Liquids include:  water, apple juice without pulp, clear Gatorade, G2 or                  Gatorade Zero (avoid Red/Purple/Blue), Black Coffee or Tea (Do not add                 anything to coffee or tea).  __X__2.  On the morning of surgery brush your teeth with toothpaste and water, you                may rinse your mouth with mouthwash if you wish.  Do not swallow any toothpaste of mouthwash.     _X__ 3.  No Alcohol for 24 hours before or after surgery.   _X__ 4.  Do Not Smoke or use e-cigarettes For 24 Hours Prior to Your Surgery.                 Do not use any chewable tobacco products for at least 6 hours prior to                 Surgery.  _X__  5.  Do not use any recreational drugs (marijuana, cocaine, heroin, ecstasy, MDMA or other)                For at least one week prior to your surgery.  Combination of these drugs with anesthesia                May have life threatening results.  _x___ 6.  Notify your doctor if there is any change in your medical condition      (cold, fever, infections).     Do not wear jewelry, make-up, hairpins, clips or nail  polish. Do not wear lotions, powders, or perfumes. You may wear deodorant. Do not shave 48 hours prior to surgery. Men may shave face and neck. Do not bring valuables to the hospital.    [facility] is not responsible for any belongings or valuables.  Contacts, dentures or bridgework may not be worn into surgery. Leave your suitcase in the car. After surgery it may be brought to your room. For patients admitted to the hospital, discharge time is determined by your treatment team.   Patients discharged the day of surgery will not be allowed to drive home.   Make  arrangements for someone to be with you for the first 24 hours of your Same Day Discharge.   __X__ Take these medicines the morning of surgery with A SIP OF WATER:    1. amLODipine (NORVASC) 10 MG  ____ Fleet Enema (as directed)   __X__ Use CHG wipes as directed  ____ Use Benzoyl Peroxide Gel as instructed  ____ Use inhalers on the day of surgery  ____ Stop metformin 2 days prior to surgery    ____ Take 1/2 of usual insulin dose the night before surgery. No insulin the morning          of surgery.   __x__ Stop Anti-inflammatories such as Ibuprofen, Aleve, Advil, naproxen and or BC powders.    __x__ Stop supplements until after surgery.    __x__ Do not start any herbal supplements before your procedure.    If you have any questions regarding your pre-procedure instructions,  Please call Pre-admit Testing at [phone]

## 2020-04-26 NOTE — Telephone Encounter (Signed)
Patient wife called concern about the blood that was in the tube of the perm-a cath. The patient wife stated there was no blood on the bandage just in the tube. I spoke with Eulogio Ditch NP and she recommended that if there is only blood in the tube and no other area this is to be expected. Patient wife was made aware with medical advice.

## 2020-04-27 ENCOUNTER — Other Ambulatory Visit: Payer: Medicaid Other

## 2020-04-27 ENCOUNTER — Other Ambulatory Visit: Payer: Self-pay

## 2020-04-27 ENCOUNTER — Encounter
Admission: RE | Admit: 2020-04-27 | Discharge: 2020-04-27 | Disposition: A | Discharge status: Home / Self Care | Payer: Medicaid Other | Source: Ambulatory Visit | Attending: Vascular Surgery | Admitting: Vascular Surgery

## 2020-04-27 DIAGNOSIS — Z20822 Contact with and (suspected) exposure to covid-19: Secondary | ICD-10-CM | POA: Insufficient documentation

## 2020-04-27 DIAGNOSIS — Z01818 Encounter for other preprocedural examination: Secondary | ICD-10-CM | POA: Diagnosis present

## 2020-04-27 LAB — BASIC METABOLIC PANEL
Anion gap: 13 (ref 5–15)
BUN: 89 mg/dL — ABNORMAL HIGH (ref 6–20)
CO2: 26 mmol/L (ref 22–32)
Calcium: 9.3 mg/dL (ref 8.9–10.3)
Chloride: 101 mmol/L (ref 98–111)
Creatinine, Ser: 8.73 mg/dL — ABNORMAL HIGH (ref 0.61–1.24)
GFR, Estimated: 7 mL/min — ABNORMAL LOW (ref >60)
Glucose, Bld: 128 mg/dL — ABNORMAL HIGH (ref 70–99)
Potassium: 4 mmol/L (ref 3.5–5.1)
Sodium: 140 mmol/L (ref 135–145)

## 2020-04-27 LAB — CBC WITH DIFFERENTIAL/PLATELET
Abs Immature Granulocytes: 0.02 10*3/uL (ref 0.00–0.07)
Basophils Absolute: 0.1 10*3/uL (ref 0.0–0.1)
Basophils Relative: 1 %
Eosinophils Absolute: 0.6 10*3/uL — ABNORMAL HIGH (ref 0.0–0.5)
Eosinophils Relative: 9 %
HCT: 30.5 % — ABNORMAL LOW (ref 39.0–52.0)
Hemoglobin: 9.9 g/dL — ABNORMAL LOW (ref 13.0–17.0)
Immature Granulocytes: 0 %
Lymphocytes Relative: 25 %
Lymphs Abs: 1.6 10*3/uL (ref 0.7–4.0)
MCH: 26.9 pg (ref 26.0–34.0)
MCHC: 32.5 g/dL (ref 30.0–36.0)
MCV: 82.9 fL (ref 80.0–100.0)
Monocytes Absolute: 0.5 10*3/uL (ref 0.1–1.0)
Monocytes Relative: 8 %
Neutro Abs: 3.7 10*3/uL (ref 1.7–7.7)
Neutrophils Relative %: 57 %
Platelets: 277 10*3/uL (ref 150–400)
RBC: 3.68 MIL/uL — ABNORMAL LOW (ref 4.22–5.81)
RDW: 12.4 % (ref 11.5–15.5)
WBC: 6.5 10*3/uL (ref 4.0–10.5)
nRBC: 0 % (ref 0.0–0.2)

## 2020-04-27 LAB — SARS CORONAVIRUS 2 (TAT 6-24 HRS): SARS Coronavirus 2: NEGATIVE

## 2020-04-27 LAB — TYPE AND SCREEN
ABO/RH(D): O POS
Antibody Screen: NEGATIVE

## 2020-04-27 LAB — PROTIME-INR
INR: 1 (ref 0.8–1.2)
Prothrombin Time: 12.4 seconds (ref 11.4–15.2)

## 2020-04-27 LAB — APTT: aPTT: 31 seconds (ref 24–36)

## 2020-04-28 ENCOUNTER — Ambulatory Visit: Payer: Medicaid Other | Admitting: Anesthesiology

## 2020-04-28 ENCOUNTER — Ambulatory Visit
Admission: RE | Admit: 2020-04-28 | Discharge: 2020-04-28 | Disposition: A | Discharge status: Home / Self Care | Payer: Medicaid Other | Attending: Vascular Surgery | Admitting: Vascular Surgery

## 2020-04-28 ENCOUNTER — Encounter
Admission: RE | Disposition: A | Discharge status: Home / Self Care | Payer: Self-pay | Source: Home / Self Care | Attending: Vascular Surgery

## 2020-04-28 ENCOUNTER — Encounter: Payer: Self-pay | Admitting: Vascular Surgery

## 2020-04-28 ENCOUNTER — Ambulatory Visit: Payer: Medicaid Other

## 2020-04-28 DIAGNOSIS — E1122 Type 2 diabetes mellitus with diabetic chronic kidney disease: Secondary | ICD-10-CM | POA: Diagnosis not present

## 2020-04-28 DIAGNOSIS — T82898A Other specified complication of vascular prosthetic devices, implants and grafts, initial encounter: Secondary | ICD-10-CM | POA: Diagnosis not present

## 2020-04-28 DIAGNOSIS — Z833 Family history of diabetes mellitus: Secondary | ICD-10-CM | POA: Diagnosis not present

## 2020-04-28 DIAGNOSIS — I120 Hypertensive chronic kidney disease with stage 5 chronic kidney disease or end stage renal disease: Secondary | ICD-10-CM | POA: Diagnosis not present

## 2020-04-28 DIAGNOSIS — Z419 Encounter for procedure for purposes other than remedying health state, unspecified: Secondary | ICD-10-CM

## 2020-04-28 DIAGNOSIS — Z992 Dependence on renal dialysis: Secondary | ICD-10-CM | POA: Diagnosis not present

## 2020-04-28 DIAGNOSIS — Z8249 Family history of ischemic heart disease and other diseases of the circulatory system: Secondary | ICD-10-CM | POA: Insufficient documentation

## 2020-04-28 DIAGNOSIS — Y9389 Activity, other specified: Secondary | ICD-10-CM | POA: Insufficient documentation

## 2020-04-28 DIAGNOSIS — X58XXXA Exposure to other specified factors, initial encounter: Secondary | ICD-10-CM | POA: Insufficient documentation

## 2020-04-28 DIAGNOSIS — N185 Chronic kidney disease, stage 5: Secondary | ICD-10-CM

## 2020-04-28 DIAGNOSIS — N186 End stage renal disease: Secondary | ICD-10-CM | POA: Insufficient documentation

## 2020-04-28 DIAGNOSIS — Z7982 Long term (current) use of aspirin: Secondary | ICD-10-CM | POA: Insufficient documentation

## 2020-04-28 DIAGNOSIS — Z79899 Other long term (current) drug therapy: Secondary | ICD-10-CM | POA: Diagnosis not present

## 2020-04-28 DIAGNOSIS — N184 Chronic kidney disease, stage 4 (severe): Secondary | ICD-10-CM | POA: Diagnosis present

## 2020-04-28 DIAGNOSIS — Z8673 Personal history of transient ischemic attack (TIA), and cerebral infarction without residual deficits: Secondary | ICD-10-CM | POA: Diagnosis not present

## 2020-04-28 HISTORY — PX: BASCILIC VEIN TRANSPOSITION: SHX5742

## 2020-04-28 LAB — POCT I-STAT, CHEM 8
BUN: 93 mg/dL — ABNORMAL HIGH (ref 6–20)
Calcium, Ion: 1.15 mmol/L (ref 1.15–1.40)
Chloride: 104 mmol/L (ref 98–111)
Creatinine, Ser: 9.2 mg/dL — ABNORMAL HIGH (ref 0.61–1.24)
Glucose, Bld: 103 mg/dL — ABNORMAL HIGH (ref 70–99)
HCT: 29 % — ABNORMAL LOW (ref 39.0–52.0)
Hemoglobin: 9.9 g/dL — ABNORMAL LOW (ref 13.0–17.0)
Potassium: 4.3 mmol/L (ref 3.5–5.1)
Sodium: 139 mmol/L (ref 135–145)
TCO2: 24 mmol/L (ref 22–32)

## 2020-04-28 SURGERY — TRANSPOSITION, VEIN, BASILIC
Anesthesia: General | Laterality: Left

## 2020-04-28 MED ORDER — FAMOTIDINE 20 MG PO TABS: 20.0000 mg | ORAL_TABLET | Freq: Once | ORAL | Status: AC

## 2020-04-28 MED ORDER — FAMOTIDINE 20 MG PO TABS
ORAL_TABLET | ORAL | Status: AC
  Administered 2020-04-28: 20 mg via ORAL
  Filled 2020-04-28: qty 1

## 2020-04-28 MED ORDER — HYDROCODONE-ACETAMINOPHEN 5-325 MG PO TABS
1.0000 | ORAL_TABLET | Freq: Four times a day (QID) | ORAL | 0 refills | Status: DC | PRN
Start: 2020-04-28 — End: 2022-06-26

## 2020-04-28 MED ORDER — FENTANYL CITRATE (PF) 100 MCG/2ML IJ SOLN: 50.0000 ug | INTRAMUSCULAR | Status: DC | PRN

## 2020-04-28 MED ORDER — IPRATROPIUM-ALBUTEROL 0.5-2.5 (3) MG/3ML IN SOLN: 3.0000 mL | Freq: Once | RESPIRATORY_TRACT | Status: AC

## 2020-04-28 MED ORDER — CHLORHEXIDINE GLUCONATE CLOTH 2 % EX PADS: 6.0000 | MEDICATED_PAD | Freq: Once | CUTANEOUS | Status: DC

## 2020-04-28 MED ORDER — PROPOFOL 500 MG/50ML IV EMUL
INTRAVENOUS | Status: DC | PRN
  Administered 2020-04-28: 50 ug/kg/min via INTRAVENOUS

## 2020-04-28 MED ORDER — BUPIVACAINE HCL (PF) 0.5 % IJ SOLN
INTRAMUSCULAR | Status: DC | PRN
  Administered 2020-04-28: 20 mL via PERINEURAL

## 2020-04-28 MED ORDER — FENTANYL CITRATE (PF) 100 MCG/2ML IJ SOLN: 25.0000 ug | INTRAMUSCULAR | Status: DC | PRN

## 2020-04-28 MED ORDER — ONDANSETRON HCL 4 MG/2ML IJ SOLN: 4.0000 mg | Freq: Four times a day (QID) | INTRAMUSCULAR | Status: DC | PRN

## 2020-04-28 MED ORDER — FENTANYL CITRATE (PF) 100 MCG/2ML IJ SOLN
INTRAMUSCULAR | Status: AC
  Filled 2020-04-28: qty 2

## 2020-04-28 MED ORDER — MIDAZOLAM HCL 2 MG/2ML IJ SOLN
INTRAMUSCULAR | Status: AC
  Filled 2020-04-28: qty 2

## 2020-04-28 MED ORDER — SODIUM CHLORIDE 0.9 % IV SOLN
INTRAVENOUS | Status: DC | PRN
  Administered 2020-04-28: 25 ug/min via INTRAVENOUS

## 2020-04-28 MED ORDER — HYDROCODONE-ACETAMINOPHEN 7.5-325 MG PO TABS: 1.0000 | ORAL_TABLET | Freq: Once | ORAL | Status: DC | PRN

## 2020-04-28 MED ORDER — PROPOFOL 10 MG/ML IV BOLUS
INTRAVENOUS | Status: AC
  Filled 2020-04-28: qty 20

## 2020-04-28 MED ORDER — HYDROMORPHONE HCL 1 MG/ML IJ SOLN: 1.0000 mg | Freq: Once | INTRAMUSCULAR | Status: DC | PRN

## 2020-04-28 MED ORDER — MIDAZOLAM HCL 2 MG/2ML IJ SOLN: 1.0000 mg | INTRAMUSCULAR | Status: DC | PRN

## 2020-04-28 MED ORDER — MIDAZOLAM HCL 2 MG/2ML IJ SOLN
INTRAMUSCULAR | Status: AC
  Administered 2020-04-28: 1 mg via INTRAVENOUS
  Filled 2020-04-28: qty 2

## 2020-04-28 MED ORDER — DEXAMETHASONE SODIUM PHOSPHATE 10 MG/ML IJ SOLN
INTRAMUSCULAR | Status: AC
  Filled 2020-04-28: qty 1

## 2020-04-28 MED ORDER — ORAL CARE MOUTH RINSE: 15.0000 mL | Freq: Once | OROMUCOSAL | Status: AC

## 2020-04-28 MED ORDER — FENTANYL CITRATE (PF) 100 MCG/2ML IJ SOLN
INTRAMUSCULAR | Status: AC
  Administered 2020-04-28: 50 ug via INTRAVENOUS
  Filled 2020-04-28: qty 2

## 2020-04-28 MED ORDER — CHLORHEXIDINE GLUCONATE CLOTH 2 % EX PADS
6.0000 | MEDICATED_PAD | Freq: Once | CUTANEOUS | Status: AC
  Administered 2020-04-28: 6 via TOPICAL

## 2020-04-28 MED ORDER — IPRATROPIUM-ALBUTEROL 0.5-2.5 (3) MG/3ML IN SOLN
RESPIRATORY_TRACT | Status: AC
  Administered 2020-04-28: 3 mL via RESPIRATORY_TRACT
  Filled 2020-04-28: qty 3

## 2020-04-28 MED ORDER — ACETAMINOPHEN 160 MG/5ML PO SOLN
325.0000 mg | ORAL | Status: DC | PRN
  Filled 2020-04-28: qty 20.3

## 2020-04-28 MED ORDER — CEFAZOLIN SODIUM-DEXTROSE 1-4 GM/50ML-% IV SOLN
1.0000 g | INTRAVENOUS | Status: AC
  Administered 2020-04-28: 1 g via INTRAVENOUS

## 2020-04-28 MED ORDER — HEPARIN SODIUM (PORCINE) 5000 UNIT/ML IJ SOLN
INTRAMUSCULAR | Status: AC
  Filled 2020-04-28: qty 1

## 2020-04-28 MED ORDER — PROMETHAZINE HCL 25 MG/ML IJ SOLN: 6.2500 mg | INTRAMUSCULAR | Status: DC | PRN

## 2020-04-28 MED ORDER — CEFAZOLIN SODIUM-DEXTROSE 1-4 GM/50ML-% IV SOLN
INTRAVENOUS | Status: AC
  Filled 2020-04-28: qty 50

## 2020-04-28 MED ORDER — SODIUM CHLORIDE 0.9 % IV SOLN: INTRAVENOUS | Status: DC

## 2020-04-28 MED ORDER — EPINEPHRINE PF 1 MG/ML IJ SOLN
INTRAMUSCULAR | Status: AC
  Filled 2020-04-28: qty 1

## 2020-04-28 MED ORDER — BUPIVACAINE HCL (PF) 0.5 % IJ SOLN
INTRAMUSCULAR | Status: AC
  Filled 2020-04-28: qty 20

## 2020-04-28 MED ORDER — "VISTASEAL 4 ML SINGLE DOSE KIT "
PACK | CUTANEOUS | Status: DC | PRN
  Administered 2020-04-28: 4 mL via TOPICAL

## 2020-04-28 MED ORDER — ONDANSETRON HCL 4 MG/2ML IJ SOLN
INTRAMUSCULAR | Status: DC | PRN
  Administered 2020-04-28: 4 mg via INTRAVENOUS

## 2020-04-28 MED ORDER — GLYCOPYRROLATE 0.2 MG/ML IJ SOLN
INTRAMUSCULAR | Status: AC
  Filled 2020-04-28: qty 1

## 2020-04-28 MED ORDER — FENTANYL CITRATE (PF) 100 MCG/2ML IJ SOLN
INTRAMUSCULAR | Status: DC | PRN
  Administered 2020-04-28: 25 ug via INTRAVENOUS
  Administered 2020-04-28: 50 ug via INTRAVENOUS
  Administered 2020-04-28: 25 ug via INTRAVENOUS

## 2020-04-28 MED ORDER — BUPIVACAINE HCL (PF) 0.5 % IJ SOLN
INTRAMUSCULAR | Status: AC
  Filled 2020-04-28: qty 30

## 2020-04-28 MED ORDER — ACETAMINOPHEN 325 MG PO TABS: 325.0000 mg | ORAL_TABLET | ORAL | Status: DC | PRN

## 2020-04-28 MED ORDER — SODIUM CHLORIDE 0.9 % IV SOLN: INTRAVENOUS | Status: DC | PRN

## 2020-04-28 MED ORDER — HEPARIN SODIUM (PORCINE) 1000 UNIT/ML IJ SOLN
INTRAMUSCULAR | Status: DC | PRN
  Administered 2020-04-28: 3000 [IU] via INTRAVENOUS

## 2020-04-28 MED ORDER — PROPOFOL 10 MG/ML IV BOLUS
INTRAVENOUS | Status: DC | PRN
  Administered 2020-04-28: 60 mg via INTRAVENOUS

## 2020-04-28 MED ORDER — CHLORHEXIDINE GLUCONATE 0.12 % MT SOLN
OROMUCOSAL | Status: AC
  Administered 2020-04-28: 15 mL via OROMUCOSAL
  Filled 2020-04-28: qty 15

## 2020-04-28 MED ORDER — CHLORHEXIDINE GLUCONATE 0.12 % MT SOLN: 15.0000 mL | Freq: Once | OROMUCOSAL | Status: AC

## 2020-04-28 MED ORDER — HEPARIN SODIUM (PORCINE) 1000 UNIT/ML IJ SOLN
INTRAMUSCULAR | Status: AC
  Filled 2020-04-28: qty 1

## 2020-04-28 MED ORDER — GLYCOPYRROLATE 0.2 MG/ML IJ SOLN
INTRAMUSCULAR | Status: DC | PRN
  Administered 2020-04-28: .1 mg via INTRAVENOUS

## 2020-04-28 MED ORDER — PHENYLEPHRINE HCL (PRESSORS) 10 MG/ML IV SOLN
INTRAVENOUS | Status: AC
  Filled 2020-04-28: qty 1

## 2020-04-28 SURGICAL SUPPLY — 61 items
ADH SKN CLS APL DERMABOND .7 (GAUZE/BANDAGES/DRESSINGS) ×3
APL PRP STRL LF DISP 70% ISPRP (MISCELLANEOUS) ×1
APPLIER CLIP 11 MED OPEN (CLIP)
APPLIER CLIP 9.375 SM OPEN (CLIP)
APR CLP MED 11 20 MLT OPN (CLIP)
APR CLP SM 9.3 20 MLT OPN (CLIP)
BAG DECANTER FOR FLEXI CONT (MISCELLANEOUS) ×3 IMPLANT
BLADE SURG 15 STRL LF DISP TIS (BLADE) ×1 IMPLANT
BLADE SURG 15 STRL SS (BLADE) ×3
BLADE SURG SZ11 CARB STEEL (BLADE) ×3 IMPLANT
BOOT SUTURE AID YELLOW STND (SUTURE) ×3 IMPLANT
BRUSH SCRUB EZ  4% CHG (MISCELLANEOUS) ×3
BRUSH SCRUB EZ 4% CHG (MISCELLANEOUS) ×1 IMPLANT
CANISTER SUCT 1200ML W/VALVE (MISCELLANEOUS) ×3 IMPLANT
CHLORAPREP W/TINT 26 (MISCELLANEOUS) ×3 IMPLANT
CLIP APPLIE 11 MED OPEN (CLIP) IMPLANT
CLIP APPLIE 9.375 SM OPEN (CLIP) IMPLANT
CLIP SPRNG 6MM S-JAW DBL (CLIP) ×3
COVER WAND RF STERILE (DRAPES) ×3 IMPLANT
DECANTER SPIKE VIAL GLASS SM (MISCELLANEOUS) ×3 IMPLANT
DERMABOND ADVANCED (GAUZE/BANDAGES/DRESSINGS) ×6
DERMABOND ADVANCED .7 DNX12 (GAUZE/BANDAGES/DRESSINGS) ×3 IMPLANT
ELECT CAUTERY BLADE 6.4 (BLADE) ×3 IMPLANT
ELECT REM PT RETURN 9FT ADLT (ELECTROSURGICAL) ×3
ELECTRODE REM PT RTRN 9FT ADLT (ELECTROSURGICAL) ×1 IMPLANT
GAUZE 4X4 16PLY RFD (DISPOSABLE) ×3 IMPLANT
GLOVE BIO SURGEON STRL SZ7 (GLOVE) ×6 IMPLANT
GLOVE INDICATOR 7.5 STRL GRN (GLOVE) ×6 IMPLANT
GOWN L4 XLG 20 PK N/S (GOWN DISPOSABLE) ×3 IMPLANT
GOWN STRL REUS W/ TWL LRG LVL3 (GOWN DISPOSABLE) ×3 IMPLANT
GOWN STRL REUS W/TWL LRG LVL3 (GOWN DISPOSABLE) ×9
HEMOSTAT SURGICEL 2X3 (HEMOSTASIS) ×3 IMPLANT
IV NS 500ML (IV SOLUTION) ×3
IV NS 500ML BAXH (IV SOLUTION) ×1 IMPLANT
KIT TURNOVER KIT A (KITS) ×3 IMPLANT
LABEL OR SOLS (LABEL) ×3 IMPLANT
LOOP RED MAXI  1X406MM (MISCELLANEOUS) ×2
LOOP VESSEL MAXI 1X406 RED (MISCELLANEOUS) ×1 IMPLANT
LOOP VESSEL MINI 0.8X406 BLUE (MISCELLANEOUS) ×2 IMPLANT
LOOPS BLUE MINI 0.8X406MM (MISCELLANEOUS) ×4
MANIFOLD NEPTUNE II (INSTRUMENTS) ×3 IMPLANT
NEEDLE FILTER BLUNT 18X 1/2SAF (NEEDLE) ×2
NEEDLE FILTER BLUNT 18X1 1/2 (NEEDLE) ×1 IMPLANT
PACK EXTREMITY (MISCELLANEOUS) ×3 IMPLANT
PAD PREP 24X41 OB/GYN DISP (PERSONAL CARE ITEMS) ×3 IMPLANT
SOLUTION CELL SAVER (CLIP) ×1 IMPLANT
STOCKINETTE STRL 4IN 9604848 (GAUZE/BANDAGES/DRESSINGS) ×6 IMPLANT
SUT MNCRL+ 5-0 UNDYED PC-3 (SUTURE) ×2 IMPLANT
SUT MONOCRYL 5-0 (SUTURE) ×4
SUT PROLENE 6 0 BV (SUTURE) ×12 IMPLANT
SUT SILK 2 0 (SUTURE) ×6
SUT SILK 2 0 SH (SUTURE) ×6 IMPLANT
SUT SILK 2-0 18XBRD TIE 12 (SUTURE) ×2 IMPLANT
SUT SILK 3 0 (SUTURE) ×3
SUT SILK 3-0 18XBRD TIE 12 (SUTURE) ×1 IMPLANT
SUT SILK 4 0 (SUTURE) ×3
SUT SILK 4-0 18XBRD TIE 12 (SUTURE) ×1 IMPLANT
SUT VIC AB 3-0 SH 27 (SUTURE) ×9
SUT VIC AB 3-0 SH 27X BRD (SUTURE) ×3 IMPLANT
SYR 20ML LL LF (SYRINGE) ×3 IMPLANT
SYR 3ML LL SCALE MARK (SYRINGE) ×3 IMPLANT

## 2020-04-28 NOTE — Transfer of Care (Signed)
Immediate Anesthesia Transfer of Care Note  Patient: Dominic Henry  Procedure(s) Performed: [place] (Left )  Patient Location: PACU  Anesthesia Type:General  Level of Consciousness: sedated  Airway & Oxygen Therapy: Patient Spontanous Breathing and Patient connected to face mask oxygen  Post-op Assessment: Report given to RN and Post -op Vital signs reviewed and stable  Post vital signs: Reviewed and stable  Last Vitals:  Vitals Value Taken Time  BP 134/85 04/28/20 1327  Temp    Pulse 85 04/28/20 1329  Resp 16 04/28/20 1329  SpO2 95 % 04/28/20 1329  Vitals shown include unvalidated device data.  Last Pain:  Vitals:   04/28/20 1029  TempSrc: Tympanic  PainSc: 0-No pain         Complications: No complications documented.

## 2020-04-28 NOTE — Anesthesia Procedure Notes (Signed)
Anesthesia Regional Block: Supraclavicular block   Pre-Anesthetic Checklist: ,, timeout performed, Correct Patient, Correct Site, Correct Laterality, Correct Procedure, Correct Position, site marked, Risks and benefits discussed,  Surgical consent,  Pre-op evaluation,  At surgeon's request and post-op pain management  Laterality: Upper and Left  Prep: chloraprep       Needles:  Injection technique: Single-shot  Needle Type: Echogenic Stimulator Needle     Needle Length: 10cm  Needle Gauge: 21   Needle insertion depth: 7 cm   Additional Needles:   Procedures: Doppler guided,,,, ultrasound used (permanent image in chart),,,,  Motor weakness within 4 minutes.  Narrative:  Start time: 04/28/2020 11:08 AM End time: 04/28/2020 11:14 AM Injection made incrementally with aspirations every 5 mL.  Performed by: Personally  Anesthesiologist: Alphonsus Sias, MD  Additional Notes: Functioning IV was confirmed and O2 [place]/monitors were applied. Light sedation administered as required, patient responsive throughout. A 142mm 21ga EchoStim needle was used. Sterile prep and drape,hand hygiene and sterile gloves were used.  Negative aspiration and negative test dose prior to incremental administration of local anesthetic. 1% Lidocaine for skin wheal, 3 ml. Total LA: 0.5% Bupivicaine 29ml/1:200 epi/8 mg decadron. U/S images stored in chart. The patient tolerated the procedure well.

## 2020-04-28 NOTE — Interval H&P Note (Signed)
History and Physical Interval Note:  04/28/2020 10:57 AM  Dominic Henry  has presented today for surgery, with the diagnosis of ESRD.  The various methods of treatment have been discussed with the patient and family. After consideration of risks, benefits and other options for treatment, the patient has consented to  Procedure(s): [place] (Left) as a surgical intervention.  The patient's history has been reviewed, patient examined, no change in status, stable for surgery.  I have reviewed the patient's chart and labs.  Questions were answered to the patient's satisfaction.     Leotis Pain

## 2020-04-28 NOTE — Anesthesia Preprocedure Evaluation (Addendum)
Anesthesia Evaluation  Patient identified by MRN, date of birth, ID band Patient awake    Reviewed: Allergy & Precautions, H&P , NPO status , reviewed documented beta blocker date and time   Airway Mallampati: III  TM Distance: >3 FB Neck ROM: full    Dental  (+) Teeth Intact, Caps, Chipped   Pulmonary pneumonia, resolved,    Pulmonary exam normal        Cardiovascular hypertension, Normal cardiovascular exam  11/2016 ECHO Study Conclusions   - Left ventricle: The cavity size was normal. Wall thickness was  increased in a pattern of mild LVH. Systolic function was normal.  The estimated ejection fraction was in the range of 60% to 65%.  Wall motion was normal; there were no regional wall motion  abnormalities. Left ventricular diastolic function parameters  were normal for the patient&'s age.  - Aortic valve: Moderately calcified annulus. Trileaflet.  - Mitral valve: There was trivial regurgitation.  - Right atrium: Central venous pressure (est): 3 mm Hg.  - Atrial septum: No defect or patent foramen ovale was identified.  - Tricuspid valve: There was trivial regurgitation.  - Pulmonary arteries: PA peak pressure: 35 mm Hg (S).  - Pericardium, extracardiac: There was no pericardial effusion.   No sig change on EKG   Neuro/Psych CVA, No Residual Symptoms    GI/Hepatic neg GERD  ,  Endo/Other    Renal/GU Renal diseaseNot on dialysis yet, start next week     Musculoskeletal   Abdominal   Peds  Hematology  (+) Blood dyscrasia, anemia ,   Anesthesia Other Findings Past Medical History: 12/16/2016: CKD (chronic kidney disease) stage 4, GFR 15-29 ml/min  ([place]) 12/16/2016: CKD (chronic kidney disease), stage III ([place]) 05/21/2013: Hypertension 05/21/2014: Stroke [facility])  Past Surgical History: 03/09/2020: AV FISTULA PLACEMENT; Left     Comment:  Procedure: ARTERIOVENOUS (AV) FISTULA CREATION                (BRACHIALBASILIC);  Surgeon: Algernon Huxley, MD;  Location:              [facility] ORS;  Service: Vascular;  Laterality: Left; 04/25/2020: DIALYSIS/PERMA CATHETER INSERTION; N/A     Comment:  Procedure: DIALYSIS/PERMA CATHETER INSERTION;  Surgeon:               Algernon Huxley, MD;  Location: [place] CV LAB;                Service: Cardiovascular;  Laterality: N/A; 04/20/2020: UPPER EXTREMITY ANGIOGRAPHY; Left     Comment:  Procedure: UPPER EXTREMITY ANGIOGRAPHY;  Surgeon: Algernon Huxley, MD;  Location: [place] CV LAB;  Service:               Cardiovascular;  Laterality: Left;     Reproductive/Obstetrics                            Anesthesia Physical Anesthesia Plan  ASA: III  Anesthesia Plan: General   Post-op Pain Management: GA combined w/ Regional for post-op pain   Induction: Intravenous  PONV Risk Score and Plan: Treatment may vary due to age or medical condition and TIVA  Airway Management Planned: Nasal Cannula and Natural Airway  Additional Equipment:   Intra-op Plan:   Post-operative Plan:   Informed Consent: I have reviewed the patients History and Physical, chart, labs and  discussed the procedure including the risks, benefits and alternatives for the proposed anesthesia with the patient or authorized representative who has indicated his/her understanding and acceptance.     Dental Advisory Given  Plan Discussed with: CRNA  Anesthesia Plan Comments:         Anesthesia Quick Evaluation

## 2020-04-28 NOTE — Op Note (Signed)
OPERATIVE NOTE   PROCEDURE: Left arm second stage basilic vein transposition (brachiobasilic arteriovenous fistula) placement  PRE-OPERATIVE DIAGNOSIS: ESRD, non-useable left arm brachiobasilic AVF  POST-OPERATIVE DIAGNOSIS: same as above  SURGEON: Leotis Pain, MD  ASSISTANT(S): Hezzie Bump, PA-C  ANESTHESIA: general  ESTIMATED BLOOD LOSS: 25 cc  FINDING(S): None  SPECIMEN(S):  None  INDICATIONS:    Patient is a 44 y.o. male who presents with end-stage renal disease and is status post an initial brachiobasilic AV fistula. The vein is deep and is not in a position that it can be used for dialysis.  The patient is scheduled for left arm second stage basilic vein transposition.  The patient is aware the risks include but are not limited to: bleeding, infection, steal syndrome, nerve damage, ischemic monomelic neuropathy, failure to mature, and need for additional procedures.  The patient is aware of the risks of the procedure and elects to proceed forward. An assistant was present during the procedure to help facilitate the exposure and expedite the procedure.  DESCRIPTION: After full informed written consent was obtained from the patient, the patient was brought back to the operating room and placed supine upon the operating table.  Prior to induction, the patient received IV antibiotics.   After obtaining adequate anesthesia, the patient was then prepped and draped in the standard fashion for a left arm access procedure.  The assistant provided retraction and mobilization to help facilitate exposure and expedite the procedure throughout the entire procedure.  This included following suture, using retractors, and optimizing lighting. I turned my attention first to identifying the patient's brachiobasilic arteriovenous fistula.  I made an longitudinal incision over the fistula from its arterial anastomosis up to its axillary extent.  I carefully dissected the fistula away from its  adjacent nerves.  Eventually the entirety of this fistula was mobilized and I dissected a plane on top of the bicipital fascia with electrocautery. Several venous branches were ligated and divided between silk ties to facilitate the exposure and transposition of the fistula. I then used a large Vanderbilt clamp to tunnel superficially from the distal portion of the incision to the proximal portion of the incision on the top of the arm. The patient was given 3000 units of intravenous heparin. I then clamped the fistula just beyond the anastomosis in the basilic vein with a profunda clamp and transected the vein about 2 cm beyond this. The vein was then transposed through the tunnel created with the large Vanderbilt clamp, taking care not to twist the vein after marking it for orientation. The vessel was flushed with heparinized saline. An end to end anastomosis was then created with the newly transposed basilic vein to the initial portion of the fistula and the basilic vein with 6-0 Prolene sutures creating an anastomosis in an end to end fashion. The vessel was flushed and de-aired prior to release of control. On release, a palpable thrill could be felt within the basilic vein.  The deep subcutaneous tissue was inspected for bleeding. I washed out the surgical site after waiting a few minutes, and there was no further bleeding.  Bleeding was controlled with electrocautery and placement of pieces of Surgicel and Evicel. The fascia was reapproximated with interrupted stitches of 2-0 Vicryl to eliminate some of the deep space.  The superficial subcutaneous tissue was then reapproximated along the incision line with a running stitch of 3-0 Vicryl.  The skin was then reapproximated with a running subcuticular of 4-0 Monocryl.  The skin  was then cleaned, dried, and reinforced with Dermabond.  The patient tolerated this procedure well and was taken to the recovery room in stable condition.  COMPLICATIONS:  None  CONDITION: Stable    Leotis Pain  04/28/2020, 1:01 PM      This note was created with Dragon Medical transcription system. Any errors in dictation are purely unintentional.

## 2020-04-28 NOTE — Discharge Instructions (Signed)
AMBULATORY SURGERY  DISCHARGE INSTRUCTIONS   1) The drugs that you were given will stay in your system until tomorrow so for the next 24 hours you should not:  A) Drive an automobile B) Make any legal decisions C) Drink any alcoholic beverage   2) You may resume regular meals tomorrow.  Today it is better to start with liquids and gradually work up to solid foods.  You may eat anything you prefer, but it is better to start with liquids, then soup and crackers, and gradually work up to solid foods.   3) Please notify your doctor immediately if you have any unusual bleeding, trouble breathing, redness and pain at the surgery site, drainage, fever, or pain not relieved by medication.    4) Additional Instructions:        Please contact your physician with any problems or Same Day Surgery at [phone], Monday through Friday 6 am to 4 pm, or [place] at [facility] number at [phone].  Peripheral Nerve Block, Upper Extremity (PNBUE) Discharge Instructions   1.  For your surgery you have received a Peripheral Nerve Block. 2. Nerve Blocks affect many types of nerves, including nerves that control movement, pain and normal sensation.  You may experience feelings such as numbness, tingling, heaviness, weakness or the inability to move your arm or the feeling or sensation that your arm has "fallen asleep". 3. A nerve block can last for 2 - 36 hours or more depending on the medication used.  Usually the weakness wears off first.  The tingling and heaviness usually wear off next.  Finally you may start to notice pain.  Keep in mind that this may occur in any order.  once a nerve block starts to wear off it is usually completely gone within 60 minutes. 4. If needed, your surgeon will give you a prescription for pain medication.  It will take about 60 minutes for the oral pain medication to become fully effective.  So, it is recommended that you start taking this medication  before the nerve block first begins to wear off, or when you first begin to feel discomfort. 5. Keep in mind that nerve blocks often wear off in the middle of the night.  If you are going to bed and the block has not started to wear off or you have not started to have any discomfort, consider setting an alarm for 2 to 3 hours, so you can assess your block.  If you notice the block is wearing off or you are starting to have discomfort, you can take your pain medication. 6. Take your pain medication only as prescribed.  Pain medication can cause sedation and decrease your breathing if you take more than you need for the level of pain that you have. 7. Nausea is a common side effect of many pain medications.  You may want to eat something before taking your pain medicine to prevent nausea. 8. After a peripheral nerve block, you cannot feel pain, pressure or extremes in temperature in the effected arm.  Because your arm is numb it is at an increased risk for injury.  To decrease the possibility of injury, please practice the following:  a. While you are awake change the position of your arm frequently to prevent too much pressure on any one area for prolonged periods of time. b.  If you have a cast or tight dressing, check the color or your fingers every couple of hours.  Call your  surgeon with the appearance of any discoloration (white or blue). c. If you are given a sling to wear before you go home, please wear it  at all times until the block has completely worn off.  Do not get up at night without your sling. d. If you experience any problems or concerns, please contact your surgeon's office. e. If you experience severe or prolonged shortness of breath go to the nearest emergency department.

## 2020-04-28 NOTE — Anesthesia Procedure Notes (Signed)
Procedure Name: LMA Insertion Date/Time: 04/28/2020 12:28 PM Performed by: Lerry Liner, CRNA Pre-anesthesia Checklist: Patient identified, Emergency Drugs available, Suction available and Patient being monitored Patient Re-evaluated:Patient Re-evaluated prior to induction Oxygen Delivery Method: Circle system utilized Preoxygenation: Pre-oxygenation with 100% oxygen Induction Type: IV induction Ventilation: Mask ventilation without difficulty LMA: LMA inserted LMA Size: 4.5 Number of attempts: 1 Placement Confirmation: ETT inserted through vocal cords under direct vision Tube secured with: Tape

## 2020-05-05 NOTE — Anesthesia Postprocedure Evaluation (Signed)
Anesthesia Post Note  Patient: Dominic Henry  Procedure(s) Performed: [place] (Left )  Patient location during evaluation: PACU Anesthesia Type: General Level of consciousness: awake and alert Pain management: pain level controlled Vital Signs Assessment: post-procedure vital signs reviewed and stable Respiratory status: spontaneous breathing, nonlabored ventilation and respiratory function stable Cardiovascular status: blood pressure returned to baseline and stable Postop Assessment: no apparent nausea or vomiting Anesthetic complications: no   No complications documented.   Last Vitals:  Vitals:   04/28/20 1427 04/28/20 1444  BP: (!) 145/88 (!) 145/85  Pulse: 89 89  Resp: 20   Temp: (!) 36.2 C   SpO2: 96% 96%    Last Pain:  Vitals:   04/29/20 0831  TempSrc:   PainSc: 0-No pain                 Alphonsus Sias

## 2020-05-12 ENCOUNTER — Other Ambulatory Visit (INDEPENDENT_AMBULATORY_CARE_PROVIDER_SITE_OTHER): Payer: Self-pay | Admitting: Vascular Surgery

## 2020-05-12 DIAGNOSIS — Z9889 Other specified postprocedural states: Secondary | ICD-10-CM

## 2020-05-12 DIAGNOSIS — N186 End stage renal disease: Secondary | ICD-10-CM

## 2020-05-18 ENCOUNTER — Ambulatory Visit (INDEPENDENT_AMBULATORY_CARE_PROVIDER_SITE_OTHER): Payer: Self-pay | Admitting: Nurse Practitioner

## 2020-05-18 ENCOUNTER — Ambulatory Visit (INDEPENDENT_AMBULATORY_CARE_PROVIDER_SITE_OTHER): Payer: Self-pay

## 2020-05-18 ENCOUNTER — Encounter (INDEPENDENT_AMBULATORY_CARE_PROVIDER_SITE_OTHER): Payer: Self-pay | Admitting: Nurse Practitioner

## 2020-05-18 ENCOUNTER — Other Ambulatory Visit: Payer: Self-pay

## 2020-05-18 VITALS — BP 125/85 | HR 81 | Ht 69.0 in | Wt 248.0 lb

## 2020-05-18 DIAGNOSIS — N186 End stage renal disease: Secondary | ICD-10-CM

## 2020-05-18 DIAGNOSIS — Z9889 Other specified postprocedural states: Secondary | ICD-10-CM

## 2020-05-18 DIAGNOSIS — I10 Essential (primary) hypertension: Secondary | ICD-10-CM

## 2020-05-22 ENCOUNTER — Encounter (INDEPENDENT_AMBULATORY_CARE_PROVIDER_SITE_OTHER): Payer: Self-pay | Admitting: Nurse Practitioner

## 2020-05-22 NOTE — Progress Notes (Signed)
Subjective:    Patient ID: Dominic Henry, male    DOB: 1976/02/23, 45 y.o.   MRN: [id number] Chief Complaint  Patient presents with  . Follow-up    U/S    The patient returns to the office for followup status post intervention of the dialysis access left brachiobasilic AV fistula.  On 04/28/2020 the patient underwent a second stage basilic transposition.. The patient denies an increase in arm swelling. At the present time the patient denies hand pain.  The patient denies amaurosis fugax or recent TIA symptoms. There are no recent neurological changes noted. The patient denies claudication symptoms or rest pain symptoms. The patient denies history of DVT, PE or superficial thrombophlebitis. The patient denies recent episodes of angina or shortness of breath.    Today the patient has a flow volume of 1085.  There is no areas of significant stenosis.  No evidence of clot or stricture seen throughout.     Review of Systems  Skin: Positive for wound.  All other systems reviewed and are negative.      Objective:   Physical Exam Vitals reviewed.  HENT:     Head: Normocephalic.  Cardiovascular:     Rate and Rhythm: Normal rate.     Pulses: Normal pulses.     Arteriovenous access: left arteriovenous access is present.    Comments: Good thrill and bruit Pulmonary:     Effort: Pulmonary effort is normal.  Skin:    General: Skin is warm and dry.  Neurological:     Mental Status: He is alert and oriented to person, place, and time.  Psychiatric:        Mood and Affect: Mood normal.        Behavior: Behavior normal.        Thought Content: Thought content normal.        Judgment: Judgment normal.     BP 125/85   Pulse 81   Ht 5\' 9"  (1.753 m)   Wt 248 lb (112.5 kg)   BMI 36.62 kg/m   Past Medical History:  Diagnosis Date  . CKD (chronic kidney disease) stage 4, GFR 15-29 ml/min (HCC) 12/16/2016  . CKD (chronic kidney disease), stage III ([place]) 12/16/2016  .  Hypertension 05/21/2013  . Stroke [facility]) 05/21/2014    Social History   Socioeconomic History  . Marital status: Married    Spouse name: Not on file  . Number of children: Not on file  . Years of education: Not on file  . Highest education level: Not on file  Occupational History  . Not on file  Tobacco Use  . Smoking status: Never Smoker  . Smokeless tobacco: Never Used  Vaping Use  . Vaping Use: Never used  Substance and Sexual Activity  . Alcohol use: No  . Drug use: No  . Sexual activity: Not on file  Other Topics Concern  . Not on file  Social History Narrative  . Not on file   Social Determinants of Health   Financial Resource Strain: Not on file  Food Insecurity: Not on file  Transportation Needs: Not on file  Physical Activity: Not on file  Stress: Not on file  Social Connections: Not on file  Intimate Partner Violence: Not on file    Past Surgical History:  Procedure Laterality Date  . AV FISTULA PLACEMENT Left 03/09/2020   Procedure: ARTERIOVENOUS (AV) FISTULA CREATION (BRACHIALBASILIC);  Surgeon: Algernon Huxley, MD;  Location: [facility] ORS;  Service: Vascular;  Laterality: Left;  . BASCILIC VEIN TRANSPOSITION Left 04/28/2020   Procedure: BASCILIC VEIN TRANSPOSITION;  Surgeon: Algernon Huxley, MD;  Location: [facility] ORS;  Service: Vascular;  Laterality: Left;  . DIALYSIS/PERMA CATHETER INSERTION N/A 04/25/2020   Procedure: DIALYSIS/PERMA CATHETER INSERTION;  Surgeon: Algernon Huxley, MD;  Location: [place] CV LAB;  Service: Cardiovascular;  Laterality: N/A;  . UPPER EXTREMITY ANGIOGRAPHY Left 04/20/2020   Procedure: UPPER EXTREMITY ANGIOGRAPHY;  Surgeon: Algernon Huxley, MD;  Location: [place] CV LAB;  Service: Cardiovascular;  Laterality: Left;    Family History  Problem Relation Age of Onset  . Hypertension Mother   . Diabetes Mother   . Hypertension Father     No Known Allergies  CBC Latest Ref Rng & Units 04/28/2020 04/27/2020 04/25/2020  WBC 4.0 - 10.5  K/uL - 6.5 8.0  Hemoglobin 13.0 - 17.0 g/dL 9.9(L) 9.9(L) 9.7(L)  Hematocrit 39.0 - 52.0 % 29.0(L) 30.5(L) 31.0(L)  Platelets 150 - 400 K/uL - 277 285      CMP     Component Value Date/Time   NA 139 04/28/2020 1045   K 4.3 04/28/2020 1045   CL 104 04/28/2020 1045   CO2 26 04/27/2020 0856   GLUCOSE 103 (H) 04/28/2020 1045   BUN 93 (H) 04/28/2020 1045   CREATININE 9.20 (H) 04/28/2020 1045   CALCIUM 9.3 04/27/2020 0856   CALCIUM 9.3 11/29/2017 1032   PROT 7.7 12/30/2017 1155   ALBUMIN 3.8 12/30/2017 1155   AST 27 12/30/2017 1155   ALT 46 (H) 12/30/2017 1155   ALKPHOS 85 12/30/2017 1155   BILITOT 0.6 12/30/2017 1155   GFRNONAA 7 (L) 04/27/2020 0856   GFRAA 23 (L) 12/12/2018 2339     No results found.     Assessment & Plan:   1. ESRD (end stage renal disease) ([place]) Recommend:  The patient is doing well and currently has adequate dialysis access. We will send the patient's dialysis center letter noting that his dialysis access is able to be utilized for dialysis. Flow pattern is stable when compared to the prior ultrasound.  The patient should have a duplex ultrasound of the dialysis access in 6 months. The patient will follow-up with me in the office after each ultrasound     2. Primary hypertension Continue antihypertensive medications as already ordered, these medications have been reviewed and there are no changes at this time.    Current Outpatient Medications on File Prior to Visit  Medication Sig Dispense Refill  . amLODipine (NORVASC) 10 MG tablet Take 10 mg by mouth daily.    Marland Kitchen aspirin EC 81 MG tablet Take 1 tablet (81 mg total) by mouth daily. 150 tablet 2  . HYDROcodone-acetaminophen (NORCO) 5-325 MG tablet Take 1-2 tablets by mouth every 6 (six) hours as needed for moderate pain. 30 tablet 0  . sodium bicarbonate 650 MG tablet Take 1,300 mg by mouth in the morning and at bedtime.     . torsemide (DEMADEX) 20 MG tablet Take 20 mg by mouth 2 (two) times  daily.    Marland Kitchen amLODipine (NORVASC) 5 MG tablet Take 1 tablet (5 mg total) by mouth daily. For hypertension 30 tablet 1  . Cholecalciferol (VITAMIN D3) 50 MCG (2000 UT) TABS Take 1 tablet by mouth daily.    Marland Kitchen losartan (COZAAR) 25 MG tablet Take 25 mg by mouth daily.     No current facility-administered medications on file prior to visit.    There are no Patient Instructions on  file for this visit. No follow-ups on file.   Kris Hartmann, NP

## 2020-07-08 ENCOUNTER — Telehealth (INDEPENDENT_AMBULATORY_CARE_PROVIDER_SITE_OTHER): Payer: Self-pay

## 2020-07-08 ENCOUNTER — Other Ambulatory Visit
Admission: RE | Admit: 2020-07-08 | Discharge: 2020-07-08 | Disposition: A | Discharge status: Home / Self Care | Payer: Medicaid Other | Source: Ambulatory Visit | Attending: Vascular Surgery | Admitting: Vascular Surgery

## 2020-07-08 ENCOUNTER — Other Ambulatory Visit: Payer: Self-pay

## 2020-07-08 DIAGNOSIS — Z01812 Encounter for preprocedural laboratory examination: Secondary | ICD-10-CM | POA: Insufficient documentation

## 2020-07-08 DIAGNOSIS — Z20822 Contact with and (suspected) exposure to covid-19: Secondary | ICD-10-CM | POA: Insufficient documentation

## 2020-07-08 NOTE — Telephone Encounter (Signed)
Spoke with the patient's wife and he is scheduled with Dr. Lucky Cowboy on 07/11/20 for a permcath removal with a 2:00 pm arrival time to the [facility]. Covid testing is today at the [facility] between 8-1 pm. A request from [facility] dialysis for the permcath removal was received.

## 2020-07-09 LAB — SARS CORONAVIRUS 2 (TAT 6-24 HRS): SARS Coronavirus 2: NEGATIVE

## 2020-07-11 ENCOUNTER — Other Ambulatory Visit (INDEPENDENT_AMBULATORY_CARE_PROVIDER_SITE_OTHER): Payer: Self-pay | Admitting: Nurse Practitioner

## 2020-07-11 ENCOUNTER — Encounter
Admission: RE | Disposition: A | Discharge status: Home / Self Care | Payer: Self-pay | Source: Home / Self Care | Attending: Vascular Surgery

## 2020-07-11 ENCOUNTER — Other Ambulatory Visit: Payer: Self-pay

## 2020-07-11 ENCOUNTER — Ambulatory Visit
Admission: RE | Admit: 2020-07-11 | Discharge: 2020-07-11 | Disposition: A | Discharge status: Home / Self Care | Payer: Medicaid Other | Attending: Vascular Surgery | Admitting: Vascular Surgery

## 2020-07-11 DIAGNOSIS — I120 Hypertensive chronic kidney disease with stage 5 chronic kidney disease or end stage renal disease: Secondary | ICD-10-CM | POA: Insufficient documentation

## 2020-07-11 DIAGNOSIS — N185 Chronic kidney disease, stage 5: Secondary | ICD-10-CM

## 2020-07-11 DIAGNOSIS — Z4901 Encounter for fitting and adjustment of extracorporeal dialysis catheter: Secondary | ICD-10-CM | POA: Diagnosis present

## 2020-07-11 DIAGNOSIS — N186 End stage renal disease: Secondary | ICD-10-CM | POA: Diagnosis not present

## 2020-07-11 HISTORY — PX: DIALYSIS/PERMA CATHETER REMOVAL: CATH118289

## 2020-07-11 SURGERY — DIALYSIS/PERMA CATHETER REMOVAL
Anesthesia: LOCAL

## 2020-07-11 SURGICAL SUPPLY — 2 items
FORCEPS HALSTEAD CVD 5IN STRL (INSTRUMENTS) ×2 IMPLANT
TRAY LACERAT/PLASTIC (MISCELLANEOUS) ×2 IMPLANT

## 2020-07-11 NOTE — H&P (Signed)
[facility] Admission History & Physical  MRN : [id number]  Dominic Henry is a 45 y.o. ([birth year]) male who presents with chief complaint of scheduled PermCath removal.  History of Present Illness:  I am asked to evaluate the patient by the dialysis center. The patient was sent here because they have a functioning dialysis access. The patient reports they're not been any problems with any of their dialysis runs. They are reporting good flows with good parameters at dialysis. Patient denies pain or tenderness overlying the access.  There is no pain with dialysis.  The patient denies hand pain or finger pain consistent with steal syndrome.  No fevers or chills while on dialysis.  No current facility-administered medications for this encounter.   Past Medical History:  Diagnosis Date  . CKD (chronic kidney disease) stage 4, GFR 15-29 ml/min (HCC) 12/16/2016  . CKD (chronic kidney disease), stage III ([place]) 12/16/2016  . Hypertension 05/21/2013  . Stroke [facility]) 05/21/2014   Past Surgical History:  Procedure Laterality Date  . AV FISTULA PLACEMENT Left 03/09/2020   Procedure: ARTERIOVENOUS (AV) FISTULA CREATION (BRACHIALBASILIC);  Surgeon: Algernon Huxley, MD;  Location: [facility] ORS;  Service: Vascular;  Laterality: Left;  . BASCILIC VEIN TRANSPOSITION Left 04/28/2020   Procedure: BASCILIC VEIN TRANSPOSITION;  Surgeon: Algernon Huxley, MD;  Location: [facility] ORS;  Service: Vascular;  Laterality: Left;  . DIALYSIS/PERMA CATHETER INSERTION N/A 04/25/2020   Procedure: DIALYSIS/PERMA CATHETER INSERTION;  Surgeon: Algernon Huxley, MD;  Location: [place] CV LAB;  Service: Cardiovascular;  Laterality: N/A;  . UPPER EXTREMITY ANGIOGRAPHY Left 04/20/2020   Procedure: UPPER EXTREMITY ANGIOGRAPHY;  Surgeon: Algernon Huxley, MD;  Location: [place] CV LAB;  Service: Cardiovascular;  Laterality: Left;   Social History Social History   Tobacco Use  . Smoking status: Never Smoker   . Smokeless tobacco: Never Used  Vaping Use  . Vaping Use: Never used  Substance Use Topics  . Alcohol use: No  . Drug use: No   Family History Family History  Problem Relation Age of Onset  . Hypertension Mother   . Diabetes Mother   . Hypertension Father   No family history of bleeding or clotting disorders, autoimmune disease or porphyria  REVIEW OF SYSTEMS (Negative unless checked)  Constitutional: '[]'$ Weight loss  '[]'$ Fever  '[]'$ Chills Cardiac: '[]'$ Chest pain   '[]'$ Chest pressure   '[]'$ Palpitations   '[]'$ Shortness of breath when laying flat   '[]'$ Shortness of breath at rest   '[x]'$ Shortness of breath with exertion. Vascular:  '[]'$ Pain in legs with walking   '[]'$ Pain in legs at rest   '[]'$ Pain in legs when laying flat   '[]'$ Claudication   '[]'$ Pain in feet when walking  '[]'$ Pain in feet at rest  '[]'$ Pain in feet when laying flat   '[]'$ History of DVT   '[]'$ Phlebitis   '[]'$ Swelling in legs   '[]'$ Varicose veins   '[]'$ Non-healing ulcers Pulmonary:   '[]'$ Uses home oxygen   '[]'$ Productive cough   '[]'$ Hemoptysis   '[]'$ Wheeze  '[]'$ COPD   '[]'$ Asthma Neurologic:  '[]'$ Dizziness  '[]'$ Blackouts   '[]'$ Seizures   '[]'$ History of stroke   '[]'$ History of TIA  '[]'$ Aphasia   '[]'$ Temporary blindness   '[]'$ Dysphagia   '[]'$ Weakness or numbness in arms   '[]'$ Weakness or numbness in legs Musculoskeletal:  '[]'$ Arthritis   '[]'$ Joint swelling   '[]'$ Joint pain   '[]'$ Low back pain Hematologic:  '[]'$ Easy bruising  '[]'$ Easy bleeding   '[]'$ Hypercoagulable state   '[]'$ Anemic  '[]'$ Hepatitis Gastrointestinal:  '[]'$ Blood in stool   '[]'$   Vomiting blood  '[]'$ Gastroesophageal reflux/heartburn   '[]'$ Difficulty swallowing. Genitourinary:  '[x]'$ Chronic kidney disease   '[]'$ Difficult urination  '[]'$ Frequent urination  '[]'$ Burning with urination   '[]'$ Blood in urine Skin:  '[]'$ Rashes   '[]'$ Ulcers   '[]'$ Wounds Psychological:  '[]'$ History of anxiety   '[]'$  History of major depression.  Physical Examination  Vitals:   07/11/20 1435  BP: (!) 141/91  Pulse: 89  Resp: 20  Temp: 98.5 F (36.9 C)  TempSrc: Oral  SpO2: 98%  Weight: 108.9 kg   Height: '5\' 9"'$  (1.753 m)   Body mass index is 35.44 kg/m. Gen: WD/WN, NAD Head: [place]/AT, No temporalis wasting. Prominent temp pulse not noted. Ear/Nose/Throat: Hearing grossly intact, nares w/o erythema or drainage, oropharynx w/o Erythema/Exudate,  Eyes: Conjunctiva clear, sclera non-icteric Neck: Trachea midline.  No JVD.  Pulmonary:  Good air movement, respirations not labored, no use of accessory muscles.  Cardiac: RRR, normal S1, S2. Vascular:  Vessel Right Left  Radial Palpable Palpable  Ulnar Not Palpable Not Palpable  Brachial Palpable Palpable  Carotid Palpable, without bruit Palpable, without bruit   Right IJ PermCath: Intact.  No signs of infection.  Gastrointestinal: soft, non-tender/non-distended. No guarding/reflex.  Musculoskeletal: M/S 5/5 throughout.  Extremities without ischemic changes.  No deformity or atrophy.  Neurologic: Sensation grossly intact in extremities.  Symmetrical.  Speech is fluent. Motor exam as listed above. Psychiatric: Judgment intact, Mood & affect appropriate for pt's clinical situation. Dermatologic: No rashes or ulcers noted.  No cellulitis or open wounds. Lymph : No Cervical, Axillary, or Inguinal lymphadenopathy.  CBC Lab Results  Component Value Date   WBC 6.5 04/27/2020   HGB 9.9 (L) 04/28/2020   HCT 29.0 (L) 04/28/2020   MCV 82.9 04/27/2020   PLT 277 04/27/2020   BMET    Component Value Date/Time   NA 139 04/28/2020 1045   K 4.3 04/28/2020 1045   CL 104 04/28/2020 1045   CO2 26 04/27/2020 0856   GLUCOSE 103 (H) 04/28/2020 1045   BUN 93 (H) 04/28/2020 1045   CREATININE 9.20 (H) 04/28/2020 1045   CALCIUM 9.3 04/27/2020 0856   CALCIUM 9.3 11/29/2017 1032   GFRNONAA 7 (L) 04/27/2020 0856   GFRAA 23 (L) 12/12/2018 2339   CrCl cannot be calculated (Patient's most recent lab result is older than the maximum 21 days allowed.).  COAG Lab Results  Component Value Date   INR 1.0 04/27/2020   INR 0.9 03/09/2020   INR 0.89  12/30/2017   Radiology No results found.  Assessment/Plan End-stage renal disease requiring hemodialysis:  The patient has an extremity access that is functioning well. Therefore, the patient will undergo removal of the tunneled catheter under local anesthesia.  The risks and benefits were described to the patient.  All questions were answered.  The patient agrees to proceed with angiography and intervention. Potassium will be drawn to ensure that it is an appropriate level prior to performing intervention.Patient will continue dialysis therapy without further interruption if a successful intervention is not achieved then a tunneled catheter will be placed. Dialysis has already been arranged. Hypertension:  Patient will continue medical management; nephrology is following no changes in oral medications.  Discussed with Dr. Mayme Genta, PA-C  07/11/2020 3:07 PM

## 2020-07-11 NOTE — Discharge Instructions (Signed)
Tunneled Catheter Removal, Care After Refer to this sheet in the next few weeks. These instructions provide you with information about caring for yourself after your procedure. Your health care provider may also give you more specific instructions. Your treatment has been planned according to current medical practices, but problems sometimes occur. Call your health care provider if you have any problems or questions after your procedure. What can I expect after the procedure? After the procedure, it is common to have: Some mild redness, swelling, and pain around your catheter site.   Follow these instructions at home: Incision care  Check your removal site  every day for signs of infection. Check for: More redness, swelling, or pain. More fluid or blood. Warmth. Pus or a bad smell. Remove your dressing in 48hrs leave open to air  Activity  Return to your normal activities as told by your health care provider. Ask your health care provider what activities are safe for you. Do not lift anything that is heavier than 10 lb (4.5 kg) for 3 days  You may shower tomorrow  Contact a health care provider if: You have more fluid or blood coming from your removal site You have more redness, swelling, or pain at your incisions or around the area where your catheter was removed Your removal site feel warm to the touch. You feel unusually weak. You feel nauseous.. Get help right away if You have swelling in your arm, shoulder, neck, or face. You develop chest pain. You have difficulty breathing. You feel dizzy or light-headed. You have pus or a bad smell coming from your removal site You have a fever. You develop bleeding from your removal site, and your bleeding does not stop. This information is not intended to replace advice given to you by your health care provider. Make sure you discuss any questions you have with your health care provider. Document Released: 04/23/2012 Document Revised:  01/08/2016 Document Reviewed: 01/31/2015 Elsevier Interactive Patient Education  2017 Elsevier Inc. 

## 2020-07-11 NOTE — Op Note (Signed)
Operative Note  Preoperative diagnosis:    1. ESRD with functional permanent access  Postoperative diagnosis:   1. ESRD with functional permanent access  Procedure:  Removal of RIGHT Permcath  Performing Clinician: Hezzie Bump PA-C  Surgeon:  Leotis Pain, MD  Anesthesia:  Local  EBL:  Minimal  Indication for the Procedure:  The patient has a functional permanent dialysis access and no longer needs their permcath.  This can be removed.  Risks and benefits are discussed and informed consent is obtained.  Description of the Procedure:  The patient's RIGHT neck, chest and existing catheter were sterilely prepped and draped. The area around the catheter was anesthetized copiously with 1% lidocaine. The catheter was dissected out with curved hemostats until the cuff was freed from the surrounding fibrous sheath. The fiber sheath was transected, and the catheter was then removed in its entirety using gentle traction. Pressure was held and sterile dressings were placed. The patient tolerated the procedure well and was taken to the recovery room in stable condition.  Srishti Strnad A Cadence Minton  07/11/2020, 3:19 PM  This note was created with Dragon Medical transcription system. Any errors in dictation are purely unintentional.

## 2020-07-12 ENCOUNTER — Encounter: Payer: Self-pay | Admitting: Vascular Surgery

## 2020-08-25 DIAGNOSIS — Z0289 Encounter for other administrative examinations: Secondary | ICD-10-CM

## 2020-11-08 ENCOUNTER — Other Ambulatory Visit (INDEPENDENT_AMBULATORY_CARE_PROVIDER_SITE_OTHER): Payer: Self-pay | Admitting: Vascular Surgery

## 2020-11-08 DIAGNOSIS — N186 End stage renal disease: Secondary | ICD-10-CM

## 2020-11-09 ENCOUNTER — Ambulatory Visit (INDEPENDENT_AMBULATORY_CARE_PROVIDER_SITE_OTHER): Payer: Medicare Other | Admitting: Nurse Practitioner

## 2020-11-09 ENCOUNTER — Encounter (INDEPENDENT_AMBULATORY_CARE_PROVIDER_SITE_OTHER): Payer: Self-pay | Admitting: Nurse Practitioner

## 2020-11-09 ENCOUNTER — Encounter (INDEPENDENT_AMBULATORY_CARE_PROVIDER_SITE_OTHER): Payer: Medicare Other

## 2020-11-09 ENCOUNTER — Encounter (INDEPENDENT_AMBULATORY_CARE_PROVIDER_SITE_OTHER): Payer: Self-pay

## 2021-08-15 ENCOUNTER — Telehealth: Payer: Self-pay

## 2021-08-15 NOTE — Telephone Encounter (Signed)
error 

## 2021-08-30 ENCOUNTER — Institutional Professional Consult (permissible substitution): Payer: Medicare Other | Admitting: Neurology

## 2021-11-07 ENCOUNTER — Encounter: Payer: Self-pay | Admitting: Internal Medicine

## 2021-11-08 ENCOUNTER — Encounter: Payer: Self-pay | Admitting: Internal Medicine

## 2021-11-08 ENCOUNTER — Encounter
Admission: RE | Disposition: A | Discharge status: Home / Self Care | Payer: Self-pay | Source: Home / Self Care | Attending: Internal Medicine

## 2021-11-08 ENCOUNTER — Ambulatory Visit: Payer: Medicare (Managed Care) | Admitting: Anesthesiology

## 2021-11-08 ENCOUNTER — Other Ambulatory Visit: Payer: Self-pay

## 2021-11-08 ENCOUNTER — Ambulatory Visit
Admission: RE | Admit: 2021-11-08 | Discharge: 2021-11-08 | Disposition: A | Discharge status: Home / Self Care | Payer: Medicare (Managed Care) | Attending: Internal Medicine | Admitting: Internal Medicine

## 2021-11-08 DIAGNOSIS — K640 First degree hemorrhoids: Secondary | ICD-10-CM | POA: Diagnosis not present

## 2021-11-08 DIAGNOSIS — I129 Hypertensive chronic kidney disease with stage 1 through stage 4 chronic kidney disease, or unspecified chronic kidney disease: Secondary | ICD-10-CM | POA: Insufficient documentation

## 2021-11-08 DIAGNOSIS — N184 Chronic kidney disease, stage 4 (severe): Secondary | ICD-10-CM | POA: Insufficient documentation

## 2021-11-08 DIAGNOSIS — Z1211 Encounter for screening for malignant neoplasm of colon: Secondary | ICD-10-CM | POA: Insufficient documentation

## 2021-11-08 DIAGNOSIS — K5140 Inflammatory polyps of colon without complications: Secondary | ICD-10-CM | POA: Insufficient documentation

## 2021-11-08 DIAGNOSIS — Z8673 Personal history of transient ischemic attack (TIA), and cerebral infarction without residual deficits: Secondary | ICD-10-CM | POA: Insufficient documentation

## 2021-11-08 DIAGNOSIS — D649 Anemia, unspecified: Secondary | ICD-10-CM | POA: Insufficient documentation

## 2021-11-08 DIAGNOSIS — D759 Disease of blood and blood-forming organs, unspecified: Secondary | ICD-10-CM | POA: Diagnosis not present

## 2021-11-08 HISTORY — DX: Dependence on renal dialysis: Z99.2

## 2021-11-08 SURGERY — COLONOSCOPY
Anesthesia: General

## 2021-11-08 MED ORDER — PROPOFOL 10 MG/ML IV BOLUS
INTRAVENOUS | Status: DC | PRN
  Administered 2021-11-08: 70 mg via INTRAVENOUS

## 2021-11-08 MED ORDER — PROPOFOL 500 MG/50ML IV EMUL
INTRAVENOUS | Status: DC | PRN
  Administered 2021-11-08: 200 ug/kg/min via INTRAVENOUS

## 2021-11-08 MED ORDER — SODIUM CHLORIDE 0.9 % IV SOLN: INTRAVENOUS | Status: DC

## 2021-11-08 NOTE — Op Note (Signed)
[facility] Patient Name: Dominic Henry Procedure Date: 11/08/2021 1:06 PM MRN: [id number] Account #: [id number] Date of Birth: 1975/07/30 Admit Type: Outpatient Age: 46 Room: [facility] ENDO ROOM 2 Gender: Male Note Status: Finalized Instrument Name: Jasper Riling [id number] Procedure:             Colonoscopy Indications:           Screening for colorectal malignant neoplasm Providers:             Lorie Apley K. Lolly Glaus MD, MD Medicines:             Propofol per Anesthesia Complications:         No immediate complications. Procedure:             Pre-Anesthesia Assessment:                        - The risks and benefits of the procedure and the                         sedation options and risks were discussed with the                         patient. All questions were answered and informed                         consent was obtained.                        - Patient identification and proposed procedure were                         verified prior to the procedure by the nurse. The                         procedure was verified in the procedure room.                        - ASA Grade Assessment: III - A patient with severe                         systemic disease.                        - After reviewing the risks and benefits, the patient                         was deemed in satisfactory condition to undergo the                         procedure.                        After obtaining informed consent, the colonoscope was                         passed under direct vision. Throughout the procedure,                         the patient's blood pressure, pulse, and oxygen  saturations were monitored continuously. The                         Colonoscope was introduced through the anus and                         advanced to the the cecum, identified by appendiceal                         orifice and ileocecal valve. The colonoscopy was                          performed without difficulty. The patient tolerated                         the procedure well. The quality of the bowel                         preparation was good. The ileocecal valve, appendiceal                         orifice, and rectum were photographed. Findings:      The perianal and digital rectal examinations were normal. Pertinent       negatives include normal sphincter tone and no palpable rectal lesions.      Non-bleeding internal hemorrhoids were found during retroflexion. The       hemorrhoids were Grade I (internal hemorrhoids that do not prolapse).      A 10 mm polyp was found in the descending colon. The polyp was       semi-pedunculated. The polyp was removed with a hot snare. Resection and       retrieval were complete.      The exam was otherwise without abnormality. Impression:            - Non-bleeding internal hemorrhoids.                        - One 10 mm polyp in the descending colon, removed                         with a hot snare. Resected and retrieved.                        - The examination was otherwise normal. Recommendation:        - Patient has a contact number available for                         emergencies. The signs and symptoms of potential                         delayed complications were discussed with the patient.                         Return to normal activities tomorrow. Written                         discharge instructions were provided to the patient.                        -  Resume previous diet.                        - Continue present medications.                        - Repeat colonoscopy is recommended for surveillance.                         The colonoscopy date will be determined after                         pathology results from today's exam become available                         for review.                        - Return to GI office PRN.                        - The findings and  recommendations were discussed with                         the patient. Procedure Code(s):     --- Professional ---                        [phone], Colonoscopy, flexible; with removal of                         tumor(s), polyp(s), or other lesion(s) by snare                         technique Diagnosis Code(s):     --- Professional ---                        K64.0, First degree hemorrhoids                        K63.5, Polyp of colon                        Z12.11, Encounter for screening for malignant neoplasm                         of colon CPT copyright 2019 American Medical Association. All rights reserved. The codes documented in this report are preliminary and upon coder review may  be revised to meet current compliance requirements. Efrain Sella MD, MD 11/08/2021 1:32:04 PM This report has been signed electronically. Number of Addenda: 0 Note Initiated On: 11/08/2021 1:06 PM Scope Withdrawal Time: 0 hours 10 minutes 23 seconds  Total Procedure Duration: 0 hours 13 minutes 0 seconds  Estimated Blood Loss:  Estimated blood loss: none.      [facility]

## 2021-11-08 NOTE — Anesthesia Procedure Notes (Signed)
Date/Time: 11/08/2021 1:13 PM  Performed by: Nelda Marseille, CRNAPre-anesthesia Checklist: Patient identified, Emergency Drugs available, Suction available, Patient being monitored and Timeout performed Oxygen Delivery Method: Simple face mask

## 2021-11-08 NOTE — Anesthesia Preprocedure Evaluation (Signed)
Anesthesia Evaluation  Patient identified by MRN, date of birth, ID band Patient awake    Reviewed: Allergy & Precautions, H&P , NPO status , Patient's Chart, lab work & pertinent test results, reviewed documented beta blocker date and time   Airway Mallampati: II   Neck ROM: full    Dental  (+) Poor Dentition   Pulmonary pneumonia, resolved,    Pulmonary exam normal        Cardiovascular Exercise Tolerance: Poor hypertension, On Medications negative cardio ROS Normal cardiovascular exam Rhythm:regular Rate:Normal     Neuro/Psych CVA negative psych ROS   GI/Hepatic negative GI ROS, Neg liver ROS,   Endo/Other  negative endocrine ROS  Renal/GU Renal disease  negative genitourinary   Musculoskeletal   Abdominal   Peds  Hematology  (+) Blood dyscrasia, anemia ,   Anesthesia Other Findings Past Medical History: 12/16/2016: CKD (chronic kidney disease) stage 4, GFR 15-29 ml/min  ([place]) 12/16/2016: CKD (chronic kidney disease), stage III ([place]) 05/21/2013: Hypertension No date: Peritoneal dialysis catheter in place [facility]) 05/21/2014: Stroke [facility]) Past Surgical History: 03/09/2020: AV FISTULA PLACEMENT; Left     Comment:  Procedure: ARTERIOVENOUS (AV) FISTULA CREATION               (BRACHIALBASILIC);  Surgeon: Algernon Huxley, MD;  Location:              [facility] ORS;  Service: Vascular;  Laterality: Left; 79/0/2409: BASCILIC VEIN TRANSPOSITION; Left     Comment:  Procedure: BASCILIC VEIN TRANSPOSITION;  Surgeon: Algernon Huxley, MD;  Location: [facility] ORS;  Service: Vascular;                Laterality: Left; 04/25/2020: DIALYSIS/PERMA CATHETER INSERTION; N/A     Comment:  Procedure: DIALYSIS/PERMA CATHETER INSERTION;  Surgeon:               Algernon Huxley, MD;  Location: [place] CV LAB;                Service: Cardiovascular;  Laterality: N/A; 07/11/2020: DIALYSIS/PERMA CATHETER REMOVAL; N/A     Comment:   Procedure: DIALYSIS/PERMA CATHETER REMOVAL;  Surgeon:               Algernon Huxley, MD;  Location: [place] CV LAB;                Service: Cardiovascular;  Laterality: N/A; 04/20/2020: UPPER EXTREMITY ANGIOGRAPHY; Left     Comment:  Procedure: UPPER EXTREMITY ANGIOGRAPHY;  Surgeon: Algernon Huxley, MD;  Location: [place] CV LAB;  Service:               Cardiovascular;  Laterality: Left; BMI    Body Mass Index: 36.18 kg/m     Reproductive/Obstetrics negative OB ROS                             Anesthesia Physical Anesthesia Plan  ASA: 4  Anesthesia Plan: General   Post-op Pain Management:    Induction:   PONV Risk Score and Plan:   Airway Management Planned:   Additional Equipment:   Intra-op Plan:   Post-operative Plan:   Informed Consent: I have reviewed the patients History and Physical, chart, labs and discussed the procedure including the risks, benefits and  alternatives for the proposed anesthesia with the patient or authorized representative who has indicated his/her understanding and acceptance.     Dental Advisory Given  Plan Discussed with: CRNA  Anesthesia Plan Comments:         Anesthesia Quick Evaluation

## 2021-11-08 NOTE — H&P (Signed)
Outpatient short stay form Pre-procedure 11/08/2021 12:34 PM Dominic Henry K. [name], M.D.  Primary Physician: Jonny Ruiz, FNP  Reason for visit:  Colon cancer screening  History of present illness:  Patient is colonoscopy naive with no known family history of colon cancer, adenomatous polyps, or IBD -No red flag symptoms such as changes in bowel habits, rectal bleeding, unintentional weight loss, anemia, or intractable abdominal pain    Current Facility-Administered Medications:    0.9 %  sodium chloride infusion, , Intravenous, Continuous, Nazir Hacker, Benay Pike, MD  Medications Prior to Admission  Medication Sig Dispense Refill Last Dose   docusate calcium (SURFAK) 240 MG capsule Take 240 mg by mouth daily.   Past Week   amLODipine (NORVASC) 10 MG tablet Take 10 mg by mouth daily. (Patient not taking: Reported on 11/08/2021)   Not Taking   amLODipine (NORVASC) 5 MG tablet Take 1 tablet (5 mg total) by mouth daily. For hypertension (Patient not taking: Reported on 11/08/2021) 30 tablet 1 Not Taking   aspirin EC 81 MG tablet Take 1 tablet (81 mg total) by mouth daily. (Patient not taking: Reported on 11/08/2021) 150 tablet 2 Not Taking   Cholecalciferol (VITAMIN D3) 50 MCG (2000 UT) TABS Take 1 tablet by mouth daily. (Patient not taking: Reported on 11/08/2021)   Not Taking   HYDROcodone-acetaminophen (NORCO) 5-325 MG tablet Take 1-2 tablets by mouth every 6 (six) hours as needed for moderate pain. (Patient not taking: Reported on 11/08/2021) 30 tablet 0 Not Taking   losartan (COZAAR) 25 MG tablet Take 25 mg by mouth daily.      sodium bicarbonate 650 MG tablet Take 1,300 mg by mouth in the morning and at bedtime.  (Patient not taking: Reported on 11/08/2021)   Not Taking   torsemide (DEMADEX) 20 MG tablet Take 20 mg by mouth 2 (two) times daily. (Patient not taking: Reported on 11/08/2021)   Not Taking     No Known Allergies   Past Medical History:  Diagnosis Date   CKD (chronic kidney disease)  stage 4, GFR 15-29 ml/min ([facility]) 12/16/2016   CKD (chronic kidney disease), stage III ([place]) 12/16/2016   Hypertension 05/21/2013   Peritoneal dialysis catheter in place [facility])    Stroke ([place]) 05/21/2014    Review of systems:  Otherwise negative.    Physical Exam  Gen: Alert, oriented. Appears stated age.  HEENT: [place]/AT. PERRLA. Lungs: CTA, no wheezes. CV: RR nl S1, S2. Abd: soft, benign, no masses. BS+ Ext: No edema. Pulses 2+    Planned procedures: Proceed with colonoscopy. The patient understands the nature of the planned procedure, indications, risks, alternatives and potential complications including but not limited to bleeding, infection, perforation, damage to internal organs and possible oversedation/side effects from anesthesia. The patient agrees and gives consent to proceed.  Please refer to procedure notes for findings, recommendations and patient disposition/instructions.     Asanti Craigo K. [name], M.D. Gastroenterology 11/08/2021  12:34 PM

## 2021-11-08 NOTE — Transfer of Care (Signed)
Immediate Anesthesia Transfer of Care Note  Patient: Dominic Henry  Procedure(s) Performed: COLONOSCOPY  Patient Location: PACU  Anesthesia Type:General  Level of Consciousness: sedated  Airway & Oxygen Therapy: Patient Spontanous Breathing and Patient connected to face mask oxygen  Post-op Assessment: Report given to RN and Post -op Vital signs reviewed and stable  Post vital signs: Reviewed and stable  Last Vitals:  Vitals Value Taken Time  BP 159/70 11/08/21 1331  Temp    Pulse 80 11/08/21 1331  Resp 22 11/08/21 1331  SpO2 100 % 11/08/21 1331    Last Pain:  Vitals:   11/08/21 1331  TempSrc:   PainSc: Asleep         Complications: No notable events documented.

## 2021-11-08 NOTE — Interval H&P Note (Signed)
History and Physical Interval Note:  11/08/2021 12:35 PM  Dominic Henry  has presented today for surgery, with the diagnosis of Colon cancer screening (Z12.11).  The various methods of treatment have been discussed with the patient and family. After consideration of risks, benefits and other options for treatment, the patient has consented to  Procedure(s): COLONOSCOPY (N/A) as a surgical intervention.  The patient's history has been reviewed, patient examined, no change in status, stable for surgery.  I have reviewed the patient's chart and labs.  Questions were answered to the patient's satisfaction.     [place], [place]

## 2021-11-09 LAB — SURGICAL PATHOLOGY

## 2021-11-09 NOTE — Anesthesia Postprocedure Evaluation (Signed)
Anesthesia Post Note  Patient: Dominic Henry  Procedure(s) Performed: COLONOSCOPY  Patient location during evaluation: PACU Anesthesia Type: General Level of consciousness: awake and alert Pain management: pain level controlled Vital Signs Assessment: post-procedure vital signs reviewed and stable Respiratory status: spontaneous breathing, nonlabored ventilation, respiratory function stable and patient connected to nasal cannula oxygen Cardiovascular status: blood pressure returned to baseline and stable Postop Assessment: no apparent nausea or vomiting Anesthetic complications: no   No notable events documented.   Last Vitals:  Vitals:   11/08/21 1350 11/08/21 1354  BP: (!) 154/87 139/83  Pulse: 74 75  Resp: 20 20  Temp:    SpO2: 100% 100%    Last Pain:  Vitals:   11/08/21 1354  TempSrc:   PainSc: 0-No pain                 Molli Barrows

## 2021-11-10 ENCOUNTER — Encounter: Payer: Self-pay | Admitting: Internal Medicine

## 2022-03-19 ENCOUNTER — Encounter (INDEPENDENT_AMBULATORY_CARE_PROVIDER_SITE_OTHER): Payer: Self-pay

## 2022-06-26 ENCOUNTER — Encounter (HOSPITAL_COMMUNITY): Payer: Self-pay

## 2022-06-26 ENCOUNTER — Other Ambulatory Visit: Payer: Self-pay

## 2022-06-26 ENCOUNTER — Emergency Department (HOSPITAL_COMMUNITY): Payer: Medicare (Managed Care)

## 2022-06-26 ENCOUNTER — Emergency Department (HOSPITAL_COMMUNITY)
Admission: EM | Admit: 2022-06-26 | Discharge: 2022-06-26 | Disposition: A | Discharge status: Home / Self Care | Payer: Medicare (Managed Care) | Attending: Emergency Medicine | Admitting: Emergency Medicine

## 2022-06-26 DIAGNOSIS — Z20822 Contact with and (suspected) exposure to covid-19: Secondary | ICD-10-CM | POA: Diagnosis not present

## 2022-06-26 DIAGNOSIS — R0981 Nasal congestion: Secondary | ICD-10-CM | POA: Diagnosis present

## 2022-06-26 DIAGNOSIS — J168 Pneumonia due to other specified infectious organisms: Secondary | ICD-10-CM | POA: Diagnosis not present

## 2022-06-26 DIAGNOSIS — J189 Pneumonia, unspecified organism: Secondary | ICD-10-CM

## 2022-06-26 LAB — BASIC METABOLIC PANEL
Anion gap: 10 (ref 5–15)
BUN: 47 mg/dL — ABNORMAL HIGH (ref 6–20)
CO2: 24 mmol/L (ref 22–32)
Calcium: 8.2 mg/dL — ABNORMAL LOW (ref 8.9–10.3)
Chloride: 102 mmol/L (ref 98–111)
Creatinine, Ser: 7.02 mg/dL — ABNORMAL HIGH (ref 0.61–1.24)
GFR, Estimated: 9 mL/min — ABNORMAL LOW (ref >60)
Glucose, Bld: 101 mg/dL — ABNORMAL HIGH (ref 70–99)
Potassium: 3.5 mmol/L (ref 3.5–5.1)
Sodium: 136 mmol/L (ref 135–145)

## 2022-06-26 LAB — CBC WITH DIFFERENTIAL/PLATELET
Abs Immature Granulocytes: 0.01 10*3/uL (ref 0.00–0.07)
Basophils Absolute: 0.1 10*3/uL (ref 0.0–0.1)
Basophils Relative: 1 %
Eosinophils Absolute: 0.5 10*3/uL (ref 0.0–0.5)
Eosinophils Relative: 9 %
HCT: 34.4 % — ABNORMAL LOW (ref 39.0–52.0)
Hemoglobin: 10.7 g/dL — ABNORMAL LOW (ref 13.0–17.0)
Immature Granulocytes: 0 %
Lymphocytes Relative: 22 %
Lymphs Abs: 1.2 10*3/uL (ref 0.7–4.0)
MCH: 26.2 pg (ref 26.0–34.0)
MCHC: 31.1 g/dL (ref 30.0–36.0)
MCV: 84.3 fL (ref 80.0–100.0)
Monocytes Absolute: 0.6 10*3/uL (ref 0.1–1.0)
Monocytes Relative: 11 %
Neutro Abs: 3 10*3/uL (ref 1.7–7.7)
Neutrophils Relative %: 57 %
Platelets: 237 10*3/uL (ref 150–400)
RBC: 4.08 MIL/uL — ABNORMAL LOW (ref 4.22–5.81)
RDW: 12.6 % (ref 11.5–15.5)
WBC: 5.3 10*3/uL (ref 4.0–10.5)
nRBC: 0 % (ref 0.0–0.2)

## 2022-06-26 LAB — RESP PANEL BY RT-PCR (RSV, FLU A&B, COVID)  RVPGX2
Influenza A by PCR: NEGATIVE
Influenza B by PCR: NEGATIVE
Resp Syncytial Virus by PCR: NEGATIVE
SARS Coronavirus 2 by RT PCR: NEGATIVE

## 2022-06-26 MED ORDER — AMOXICILLIN-POT CLAVULANATE 875-125 MG PO TABS: 1.0000 | ORAL_TABLET | Freq: Two times a day (BID) | ORAL | 0 refills | Status: DC

## 2022-06-26 MED ORDER — AMOXICILLIN-POT CLAVULANATE 875-125 MG PO TABS
1.0000 | ORAL_TABLET | Freq: Once | ORAL | Status: AC
  Administered 2022-06-26: 1 via ORAL
  Filled 2022-06-26: qty 1

## 2022-06-26 NOTE — ED Triage Notes (Signed)
Reports cough and body aches since Saturday.

## 2022-06-26 NOTE — ED Provider Notes (Signed)
[facility] Provider Note   CSN: [id number] Arrival date & time: 06/26/22  1455     History  Chief Complaint  Patient presents with   URI    Dominic Henry is a 47 y.o. male.  The history is provided by the patient. No language interpreter was used.  URI Presenting symptoms: congestion   Severity:  Moderate Onset quality:  Gradual Duration:  4 days Timing:  Constant Progression:  Worsening Chronicity:  New Relieved by:  Nothing Worsened by:  Nothing Ineffective treatments:  None tried Risk factors: chronic kidney disease        Home Medications Prior to Admission medications   Medication Sig Start Date End Date Taking? Authorizing Provider  amoxicillin-clavulanate (AUGMENTIN) 875-125 MG tablet Take 1 tablet by mouth 2 (two) times daily. 06/26/22  Yes Caryl Ada K, PA-C  docusate calcium (SURFAK) 240 MG capsule Take 240 mg by mouth daily.    [provider]      Allergies    Patient has no known allergies.    Review of Systems   Review of Systems  HENT:  Positive for congestion.   All other systems reviewed and are negative.   Physical Exam Updated Vital Signs BP 121/72 (BP Location: Right Arm)   Pulse 77   Temp 98.5 F (36.9 C) (Oral)   Resp 16   Ht 5\' 9"  (1.753 m)   Wt 111.1 kg   SpO2 98%   BMI 36.18 kg/m  Physical Exam Vitals and nursing note reviewed.  Constitutional:      Appearance: He is well-developed.  HENT:     Head: Normocephalic.     Mouth/Throat:     Mouth: Mucous membranes are moist.  Eyes:     Pupils: Pupils are equal, round, and reactive to light.  Cardiovascular:     Rate and Rhythm: Normal rate.  Pulmonary:     Effort: Pulmonary effort is normal.  Abdominal:     General: There is no distension.  Musculoskeletal:        General: Normal range of motion.     Cervical back: Normal range of motion.  Skin:    General: Skin is warm.  Neurological:     General: No  focal deficit present.     Mental Status: He is alert and oriented to person, place, and time.     ED Results / Procedures / Treatments   Labs (all labs ordered are listed, but only abnormal results are displayed) Labs Reviewed  CBC WITH DIFFERENTIAL/PLATELET - Abnormal; Notable for the following components:      Result Value   RBC 4.08 (*)    Hemoglobin 10.7 (*)    HCT 34.4 (*)    All other components within normal limits  BASIC METABOLIC PANEL - Abnormal; Notable for the following components:   Glucose, Bld 101 (*)    BUN 47 (*)    Creatinine, Ser 7.02 (*)    Calcium 8.2 (*)    GFR, Estimated 9 (*)    All other components within normal limits  RESP PANEL BY RT-PCR (RSV, FLU A&B, COVID)  [id number]    EKG None  Radiology DG Chest 2 View  Result Date: 06/26/2022 CLINICAL DATA:  Three day history of cough EXAM: CHEST - 2 VIEW COMPARISON:  Chest radiograph dated 10/28/2017 FINDINGS: Normal lung volumes. Right perihilar opacity. Bilateral lower lung linear opacities. No pleural effusion or pneumothorax. The heart size and mediastinal contours  are within normal limits. The visualized skeletal structures are unremarkable. IMPRESSION: Right perihilar opacity, which may represent pneumonia in the acute setting. Recommend follow-up chest x-ray in 3-4 weeks to ensure resolution. Alternatively, chest CT can be considered for further evaluation. Electronically Signed   By: Darrin Nipper M.D.   On: 06/26/2022 16:49    Procedures Procedures    Medications Ordered in ED Medications  amoxicillin-clavulanate (AUGMENTIN) 875-125 MG per tablet 1 tablet (1 tablet Oral Given 06/26/22 1829)    ED Course/ Medical Decision Making/ A&P                             Medical Decision Making Patient complains of a cough and congestion for the past 4 days patient complains of pain in his chest when he coughs he has a history of pneumonia.  Patient has chronic kidney disease.  He is on renal transplant  list  Amount and/or Complexity of Data Reviewed Independent Historian: spouse    Details: With a family member who is supportive External Data Reviewed: notes.    Details: Neurology notes reviewed Labs: ordered. Decision-making details documented in ED Course.    Details: COVID influenza and RSV are negative patient has a normal potassium BUN is 47 creatinine is 7.02 Radiology: ordered and independent interpretation performed.    Details: Chest x-ray shows right perihilar opacity.  Risk Prescription drug management. Risk Details: Patient counseled on laboratory evaluation he is given a prescription for Augmentin patient is advised to follow-up with his primary care physician he is advised of need for follow-up chest x-ray for clearing in 4 weeks           Final Clinical Impression(s) / ED Diagnoses Final diagnoses:  Pneumonia of right lung due to infectious organism, unspecified part of lung    Rx / DC Orders ED Discharge Orders          Ordered    amoxicillin-clavulanate (AUGMENTIN) 875-125 MG tablet  2 times daily        06/26/22 1826           An After Visit Summary was printed and given to the patient.    Sidney Ace 06/26/22 1902    Hayden Rasmussen, MD 06/27/22 1018

## 2022-06-26 NOTE — Discharge Instructions (Addendum)
See your Physician for recheck in 2-3 days

## 2022-09-22 ENCOUNTER — Encounter (HOSPITAL_COMMUNITY): Payer: Self-pay

## 2022-09-22 ENCOUNTER — Other Ambulatory Visit: Payer: Self-pay

## 2022-09-22 ENCOUNTER — Emergency Department (HOSPITAL_COMMUNITY): Payer: Medicaid - Out of State

## 2022-09-22 ENCOUNTER — Emergency Department (HOSPITAL_COMMUNITY)
Admission: EM | Admit: 2022-09-22 | Discharge: 2022-09-22 | Disposition: A | Discharge status: Home / Self Care | Payer: Medicaid - Out of State | Attending: Emergency Medicine | Admitting: Emergency Medicine

## 2022-09-22 DIAGNOSIS — K1121 Acute sialoadenitis: Secondary | ICD-10-CM | POA: Insufficient documentation

## 2022-09-22 DIAGNOSIS — N184 Chronic kidney disease, stage 4 (severe): Secondary | ICD-10-CM | POA: Diagnosis not present

## 2022-09-22 DIAGNOSIS — I129 Hypertensive chronic kidney disease with stage 1 through stage 4 chronic kidney disease, or unspecified chronic kidney disease: Secondary | ICD-10-CM | POA: Diagnosis not present

## 2022-09-22 DIAGNOSIS — B9689 Other specified bacterial agents as the cause of diseases classified elsewhere: Secondary | ICD-10-CM | POA: Insufficient documentation

## 2022-09-22 DIAGNOSIS — R221 Localized swelling, mass and lump, neck: Secondary | ICD-10-CM | POA: Diagnosis present

## 2022-09-22 LAB — CBC WITH DIFFERENTIAL/PLATELET
Abs Immature Granulocytes: 0.01 10*3/uL (ref 0.00–0.07)
Basophils Absolute: 0 10*3/uL (ref 0.0–0.1)
Basophils Relative: 1 %
Eosinophils Absolute: 0.4 10*3/uL (ref 0.0–0.5)
Eosinophils Relative: 6 %
HCT: 33.4 % — ABNORMAL LOW (ref 39.0–52.0)
Hemoglobin: 10.6 g/dL — ABNORMAL LOW (ref 13.0–17.0)
Immature Granulocytes: 0 %
Lymphocytes Relative: 23 %
Lymphs Abs: 1.5 10*3/uL (ref 0.7–4.0)
MCH: 26.8 pg (ref 26.0–34.0)
MCHC: 31.7 g/dL (ref 30.0–36.0)
MCV: 84.6 fL (ref 80.0–100.0)
Monocytes Absolute: 0.5 10*3/uL (ref 0.1–1.0)
Monocytes Relative: 8 %
Neutro Abs: 4.4 10*3/uL (ref 1.7–7.7)
Neutrophils Relative %: 62 %
Platelets: 245 10*3/uL (ref 150–400)
RBC: 3.95 MIL/uL — ABNORMAL LOW (ref 4.22–5.81)
RDW: 12.3 % (ref 11.5–15.5)
WBC: 6.9 10*3/uL (ref 4.0–10.5)
nRBC: 0 % (ref 0.0–0.2)

## 2022-09-22 LAB — BASIC METABOLIC PANEL
Anion gap: 8 (ref 5–15)
BUN: 56 mg/dL — ABNORMAL HIGH (ref 6–20)
CO2: 24 mmol/L (ref 22–32)
Calcium: 7.6 mg/dL — ABNORMAL LOW (ref 8.9–10.3)
Chloride: 104 mmol/L (ref 98–111)
Creatinine, Ser: 7.47 mg/dL — ABNORMAL HIGH (ref 0.61–1.24)
GFR, Estimated: 8 mL/min — ABNORMAL LOW (ref >60)
Glucose, Bld: 104 mg/dL — ABNORMAL HIGH (ref 70–99)
Potassium: 3.5 mmol/L (ref 3.5–5.1)
Sodium: 136 mmol/L (ref 135–145)

## 2022-09-22 MED ORDER — OXYCODONE-ACETAMINOPHEN 5-325 MG PO TABS
1.0000 | ORAL_TABLET | Freq: Once | ORAL | Status: AC
  Administered 2022-09-22: 1 via ORAL
  Filled 2022-09-22: qty 1

## 2022-09-22 MED ORDER — AMOXICILLIN-POT CLAVULANATE 500-125 MG PO TABS: 1.0000 | ORAL_TABLET | Freq: Two times a day (BID) | ORAL | 0 refills | Status: AC

## 2022-09-22 MED ORDER — AMOXICILLIN-POT CLAVULANATE 500-125 MG PO TABS
1.0000 | ORAL_TABLET | Freq: Once | ORAL | Status: AC
  Administered 2022-09-22: 1 via ORAL
  Filled 2022-09-22: qty 1

## 2022-09-22 MED ORDER — AMOXICILLIN-POT CLAVULANATE 500-125 MG PO TABS: 1.0000 | ORAL_TABLET | Freq: Two times a day (BID) | ORAL | 0 refills | Status: DC

## 2022-09-22 NOTE — ED Provider Notes (Signed)
[place] EMERGENCY DEPARTMENT AT [facility] Provider Note  CSN: [id number] Arrival date & time: 09/22/22 8295  Chief Complaint(s) Neck Pain  HPI Dominic Henry is a 47 y.o. male with history of CKD, prior stroke, hypertension presenting to the emergency department with neck swelling.  Patient reports he is eating a hamburger helper and then felt some trouble swallowing.  Reports that he was having pain to his left neck region.  Reports that he felt a lump there.  No fevers or chills.  Was not eating anything with bones in it.  No dental pain.  No facial swelling.     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   Hyperkalemia 10/13/2019   Obesity 10/13/2019   Chronic kidney disease due to hypertension 10/13/2019   Hyperparathyroidism due to renal insufficiency (HCC) 10/13/2019   Osteoarthritis 03/09/2019   Synovitis and tenosynovitis 03/09/2019   Proteinuria 12/31/2017   Vitamin B12 deficiency 12/15/2016   Normocytic anemia 12/14/2016   CAP (community acquired pneumonia) 12/13/2016   AKI (acute kidney injury) (HCC) 12/13/2016   Essential hypertension 12/13/2016   Acute kidney failure, unspecified (HCC) 12/13/2016   Community acquired pneumonia 12/13/2016   Home Medication(s) Prior to Admission medications   Medication Sig Start Date End Date Taking? Authorizing Provider  amoxicillin-clavulanate (AUGMENTIN) 875-125 MG tablet Take 1 tablet by mouth 2 (two) times daily. 06/26/22    Elson Areas, PA-C  docusate calcium (SURFAK) 240 MG capsule Take 240 mg by mouth daily.    [provider]                                                                                                                                    Past Surgical History Past Surgical History:  Procedure Laterality Date   AV FISTULA PLACEMENT Left 03/09/2020   Procedure: ARTERIOVENOUS (AV) FISTULA CREATION (BRACHIALBASILIC);  Surgeon: Annice Needy, MD;  Location: [facility] ORS;  Service: Vascular;  Laterality: Left;   BASCILIC VEIN TRANSPOSITION Left 04/28/2020   Procedure: BASCILIC VEIN TRANSPOSITION;  Surgeon: Annice Needy, MD;  Location: [facility] ORS;  Service: Vascular;  Laterality: Left;   COLONOSCOPY N/A 11/08/2021   Procedure: COLONOSCOPY;  Surgeon: Toledo, Boykin Nearing, MD;  Location: [facility] ENDOSCOPY;  Service: Gastroenterology;  Laterality: N/A;   DIALYSIS/PERMA CATHETER INSERTION N/A 04/25/2020   Procedure: DIALYSIS/PERMA CATHETER INSERTION;  Surgeon: Annice Needy, MD;  Location: [facility] INVASIVE CV LAB;  Service: Cardiovascular;  Laterality:  N/A;   DIALYSIS/PERMA CATHETER REMOVAL N/A 07/11/2020   Procedure: DIALYSIS/PERMA CATHETER REMOVAL;  Surgeon: Annice Needy, MD;  Location: [facility] INVASIVE CV LAB;  Service: Cardiovascular;  Laterality: N/A;   UPPER EXTREMITY ANGIOGRAPHY Left 04/20/2020   Procedure: UPPER EXTREMITY ANGIOGRAPHY;  Surgeon: Annice Needy, MD;  Location: [facility] INVASIVE CV LAB;  Service: Cardiovascular;  Laterality: Left;   Family History Family History  Problem Relation Age of Onset   Hypertension Mother    Diabetes Mother    Hypertension Father     Social History Social History   Tobacco Use   Smoking status: Never   Smokeless tobacco: Never  Vaping Use   Vaping Use: Never used  Substance Use Topics   Alcohol use: No   Drug use: No   Allergies Patient has no known allergies.  Review of Systems Review of Systems  All other systems reviewed and are  negative.   Physical Exam Vital Signs  I have reviewed the triage vital signs BP (!) 150/95   Pulse 79   Temp 98.9 F (37.2 C) (Oral)   Resp 18   Ht 5\' 9"  (1.753 m)   Wt 111.6 kg   SpO2 98%   BMI 36.33 kg/m  Physical Exam Vitals and nursing note reviewed.  Constitutional:      General: He is not in acute distress.    Appearance: Normal appearance.  HENT:     Head:     Comments: Small area of swelling to the left submandibular area.  Dentition poor but no focal swelling, floor of mouth soft, no sign of periapical abscess, uvula midline, normal voice, no respiratory distress, tolerating secretions.    Mouth/Throat:     Mouth: Mucous membranes are moist.  Eyes:     Conjunctiva/sclera: Conjunctivae normal.  Cardiovascular:     Rate and Rhythm: Normal rate and regular rhythm.  Pulmonary:     Effort: Pulmonary effort is normal. No respiratory distress.     Breath sounds: Normal breath sounds.  Abdominal:     General: Abdomen is flat.     Palpations: Abdomen is soft.     Tenderness: There is no abdominal tenderness.  Musculoskeletal:     Right lower leg: No edema.     Left lower leg: No edema.  Skin:    General: Skin is warm and dry.     Capillary Refill: Capillary refill takes less than 2 seconds.  Neurological:     Mental Status: He is alert and oriented to person, place, and time. Mental status is at baseline.  Psychiatric:        Mood and Affect: Mood normal.        Behavior: Behavior normal.     ED Results and Treatments Labs (all labs ordered are listed, but only abnormal results are displayed) Labs Reviewed  CBC WITH DIFFERENTIAL/PLATELET - Abnormal; Notable for the following components:      Result Value   RBC 3.95 (*)    Hemoglobin 10.6 (*)    HCT 33.4 (*)    All other components within normal limits  BASIC METABOLIC PANEL  Radiology No results found.  Pertinent labs & imaging results that were available during my care of the patient were reviewed by me and considered in my medical decision making (see MDM for details).  Medications Ordered in ED Medications  oxyCODONE-acetaminophen (PERCOCET/ROXICET) 5-325 MG per tablet 1 tablet (1 tablet Oral Given 09/22/22 2002)                                                                                                                                     Procedures Procedures  (including critical care time)  Medical Decision Making / ED Course   MDM:  47 year old male presenting to the emergency department facial swelling.  Patient well-appearing, does have small palpable area of swelling to submandibular region.  Differential includes sialoadenitis, lymphadenopathy, abscess.  Doubt Ludwig's angina, dental infection, deep space neck infection.  Patient is overall very well-appearing.  He reports that he was concerned it was related to the food he was eating however he was eating hamburger helper and denies eating any bony or hard foods such as fish or bony meat.  Doubt food impaction given location of swelling and external facial focal area of swelling.  Will reassess.      Additional history obtained: -Additional history obtained from {wsadditionalhistorian:28072} -External records from outside source obtained and reviewed including: Chart review including previous notes, labs, imaging, consultation notes including ***   Lab Tests: -I ordered, reviewed, and interpreted labs.   The pertinent results include:   Labs Reviewed  CBC WITH DIFFERENTIAL/PLATELET - Abnormal; Notable for the following components:      Result Value   RBC 3.95 (*)    Hemoglobin 10.6 (*)    HCT 33.4 (*)    All other components within normal limits  BASIC METABOLIC PANEL    Notable for ***  EKG   EKG Interpretation  Date/Time:    Ventricular Rate:    PR Interval:    QRS  Duration:   QT Interval:    QTC Calculation:   R Axis:     Text Interpretation:           Imaging Studies ordered: I ordered imaging studies including *** On my interpretation imaging demonstrates *** I independently visualized and interpreted imaging. I agree with the radiologist interpretation   Medicines ordered and prescription drug management: Meds ordered this encounter  Medications   oxyCODONE-acetaminophen (PERCOCET/ROXICET) 5-325 MG per tablet 1 tablet    -I have reviewed the patients home medicines and have made adjustments as needed   Consultations Obtained: I requested consultation with the ***,  and discussed lab and imaging findings as well as pertinent plan - they recommend: ***   Cardiac Monitoring: The patient was maintained on a cardiac monitor.  I personally viewed and interpreted the cardiac monitored which showed an underlying rhythm of: ***  Social Determinants of Health:  Diagnosis or treatment significantly limited by social determinants of health: {  wssoc:28071}   Reevaluation: After the interventions noted above, I reevaluated the patient and found that their symptoms have {resolved/improved/worsened:23923::"improved"}  Co morbidities that complicate the patient evaluation  Past Medical History:  Diagnosis Date   CKD (chronic kidney disease) stage 4, GFR 15-29 ml/min (HCC) 12/16/2016   CKD (chronic kidney disease), stage III (HCC) 12/16/2016   Hypertension 05/21/2013   Peritoneal dialysis catheter in place [facility])    Stroke (HCC) 05/21/2014      Dispostion: Disposition decision including need for hospitalization was considered, and patient {wsdispo:28070::"discharged from emergency department."}    Final Clinical Impression(s) / ED Diagnoses Final diagnoses:  None     This chart was dictated using voice recognition software.  Despite best efforts to proofread,  errors can occur which can change the documentation meaning.

## 2022-09-22 NOTE — ED Triage Notes (Signed)
Pt presents with sudden onset of L sided neck pain and sudden appearance of a knot just medially to the mandibular angle. Symptoms started when he was chewing his food. Pt states it is now painful to swallow.

## 2022-09-22 NOTE — Discharge Instructions (Addendum)
We evaluated you for your swelling.  Your CT scan shows that your symptoms are caused by blocked salivary gland.  You also have a small infection.  We have started you on antibiotics.  Please take these as prescribed.  Please also suck on sour candy.  This helps to create more saliva to help unblock your salivary glands.  Please follow-up with the ENT physician Dr. Suszanne Conners so that they can help make sure that your symptoms are getting better.  If you develop any new or worsening symptoms such as worsening facial swelling, difficulty swallowing, difficulty breathing, tongue swelling, high fevers, confusion, or any other concerning symptoms, please return to the emergency department.

## 2022-10-17 ENCOUNTER — Other Ambulatory Visit (HOSPITAL_COMMUNITY): Payer: Self-pay | Admitting: Nephrology

## 2022-10-17 ENCOUNTER — Encounter (HOSPITAL_COMMUNITY): Payer: Self-pay | Admitting: Nephrology

## 2022-10-17 DIAGNOSIS — N189 Chronic kidney disease, unspecified: Secondary | ICD-10-CM

## 2022-10-17 DIAGNOSIS — Z7682 Awaiting organ transplant status: Secondary | ICD-10-CM

## 2022-10-18 ENCOUNTER — Ambulatory Visit (HOSPITAL_COMMUNITY): Admission: RE | Admit: 2022-10-18 | Payer: Medicare (Managed Care) | Source: Ambulatory Visit

## 2022-11-01 ENCOUNTER — Other Ambulatory Visit (HOSPITAL_COMMUNITY): Payer: Self-pay | Admitting: Nephrology

## 2022-11-01 ENCOUNTER — Ambulatory Visit (HOSPITAL_COMMUNITY)
Admission: RE | Admit: 2022-11-01 | Discharge: 2022-11-01 | Disposition: A | Discharge status: Home / Self Care | Payer: Medicare (Managed Care) | Source: Ambulatory Visit | Attending: Nephrology | Admitting: Nephrology

## 2022-11-01 DIAGNOSIS — N189 Chronic kidney disease, unspecified: Secondary | ICD-10-CM | POA: Insufficient documentation

## 2022-11-01 DIAGNOSIS — Z7682 Awaiting organ transplant status: Secondary | ICD-10-CM | POA: Insufficient documentation

## 2022-11-01 DIAGNOSIS — I6523 Occlusion and stenosis of bilateral carotid arteries: Secondary | ICD-10-CM | POA: Insufficient documentation

## 2023-08-19 ENCOUNTER — Other Ambulatory Visit: Payer: Self-pay

## 2023-08-19 ENCOUNTER — Emergency Department (HOSPITAL_COMMUNITY)
Admission: EM | Admit: 2023-08-19 | Discharge: 2023-08-19 | Disposition: A | Discharge status: Home / Self Care | Payer: Medicare (Managed Care) | Attending: Emergency Medicine | Admitting: Emergency Medicine

## 2023-08-19 ENCOUNTER — Encounter (HOSPITAL_COMMUNITY): Payer: Self-pay

## 2023-08-19 ENCOUNTER — Emergency Department (HOSPITAL_COMMUNITY): Payer: Medicare (Managed Care)

## 2023-08-19 DIAGNOSIS — R059 Cough, unspecified: Secondary | ICD-10-CM | POA: Diagnosis present

## 2023-08-19 DIAGNOSIS — D649 Anemia, unspecified: Secondary | ICD-10-CM | POA: Insufficient documentation

## 2023-08-19 DIAGNOSIS — R051 Acute cough: Secondary | ICD-10-CM

## 2023-08-19 DIAGNOSIS — I120 Hypertensive chronic kidney disease with stage 5 chronic kidney disease or end stage renal disease: Secondary | ICD-10-CM | POA: Diagnosis not present

## 2023-08-19 DIAGNOSIS — N186 End stage renal disease: Secondary | ICD-10-CM | POA: Insufficient documentation

## 2023-08-19 DIAGNOSIS — J301 Allergic rhinitis due to pollen: Secondary | ICD-10-CM | POA: Insufficient documentation

## 2023-08-19 DIAGNOSIS — Z79899 Other long term (current) drug therapy: Secondary | ICD-10-CM | POA: Insufficient documentation

## 2023-08-19 DIAGNOSIS — R0602 Shortness of breath: Secondary | ICD-10-CM | POA: Diagnosis not present

## 2023-08-19 DIAGNOSIS — Z9109 Other allergy status, other than to drugs and biological substances: Secondary | ICD-10-CM

## 2023-08-19 DIAGNOSIS — Z8673 Personal history of transient ischemic attack (TIA), and cerebral infarction without residual deficits: Secondary | ICD-10-CM | POA: Insufficient documentation

## 2023-08-19 LAB — CBC WITH DIFFERENTIAL/PLATELET
Abs Immature Granulocytes: 0.01 10*3/uL (ref 0.00–0.07)
Basophils Absolute: 0.1 10*3/uL (ref 0.0–0.1)
Basophils Relative: 1 %
Eosinophils Absolute: 0.6 10*3/uL — ABNORMAL HIGH (ref 0.0–0.5)
Eosinophils Relative: 9 %
HCT: 29.1 % — ABNORMAL LOW (ref 39.0–52.0)
Hemoglobin: 8.8 g/dL — ABNORMAL LOW (ref 13.0–17.0)
Immature Granulocytes: 0 %
Lymphocytes Relative: 22 %
Lymphs Abs: 1.4 10*3/uL (ref 0.7–4.0)
MCH: 26.8 pg (ref 26.0–34.0)
MCHC: 30.2 g/dL (ref 30.0–36.0)
MCV: 88.7 fL (ref 80.0–100.0)
Monocytes Absolute: 0.5 10*3/uL (ref 0.1–1.0)
Monocytes Relative: 7 %
Neutro Abs: 3.9 10*3/uL (ref 1.7–7.7)
Neutrophils Relative %: 61 %
Platelets: 259 10*3/uL (ref 150–400)
RBC: 3.28 MIL/uL — ABNORMAL LOW (ref 4.22–5.81)
RDW: 12.8 % (ref 11.5–15.5)
WBC: 6.5 10*3/uL (ref 4.0–10.5)
nRBC: 0 % (ref 0.0–0.2)

## 2023-08-19 LAB — RESP PANEL BY RT-PCR (RSV, FLU A&B, COVID)  RVPGX2
Influenza A by PCR: NEGATIVE
Influenza B by PCR: NEGATIVE
Resp Syncytial Virus by PCR: NEGATIVE
SARS Coronavirus 2 by RT PCR: NEGATIVE

## 2023-08-19 LAB — COMPREHENSIVE METABOLIC PANEL WITH GFR
ALT: 22 U/L (ref 0–44)
AST: 16 U/L (ref 15–41)
Albumin: 3.3 g/dL — ABNORMAL LOW (ref 3.5–5.0)
Alkaline Phosphatase: 55 U/L (ref 38–126)
Anion gap: 16 — ABNORMAL HIGH (ref 5–15)
BUN: 72 mg/dL — ABNORMAL HIGH (ref 6–20)
CO2: 22 mmol/L (ref 22–32)
Calcium: 8.8 mg/dL — ABNORMAL LOW (ref 8.9–10.3)
Chloride: 98 mmol/L (ref 98–111)
Creatinine, Ser: 13.64 mg/dL — ABNORMAL HIGH (ref 0.61–1.24)
GFR, Estimated: 4 mL/min — ABNORMAL LOW (ref >60)
Glucose, Bld: 102 mg/dL — ABNORMAL HIGH (ref 70–99)
Potassium: 3.7 mmol/L (ref 3.5–5.1)
Sodium: 136 mmol/L (ref 135–145)
Total Bilirubin: 0.6 mg/dL (ref 0.0–1.2)
Total Protein: 7.6 g/dL (ref 6.5–8.1)

## 2023-08-19 LAB — TROPONIN I (HIGH SENSITIVITY)
Troponin I (High Sensitivity): 10 ng/L (ref <18)
Troponin I (High Sensitivity): 12 ng/L (ref <18)

## 2023-08-19 MED ORDER — BENZONATATE 100 MG PO CAPS: 200.0000 mg | ORAL_CAPSULE | Freq: Three times a day (TID) | ORAL | 0 refills | Status: AC | PRN

## 2023-08-19 MED ORDER — BENZONATATE 100 MG PO CAPS
200.0000 mg | ORAL_CAPSULE | Freq: Once | ORAL | Status: AC
  Administered 2023-08-19: 200 mg via ORAL
  Filled 2023-08-19: qty 2

## 2023-08-19 NOTE — ED Provider Notes (Signed)
 [place] EMERGENCY DEPARTMENT AT [facility] Provider Note   CSN: [id number] Arrival date & time: 08/19/23  1826     History  Chief Complaint  Patient presents with   Cough    Dominic Henry is a 48 y.o. male presenting with a 3-day history of allergy-like symptoms, describing a nonproductive cough along with nasal drainage, clear rhinorrhea, sneezing and itchy eyes.  He describes a burning pain in his mid chest which is triggered by coughing.  He denies shortness of breath.  He is asymptomatic regarding the chest pain, only occurs when he coughs.  He has no orthopnea, denies fevers or chills, no abdominal pain, no peripheral edema.  He is a home peritoneal dialysis patient, other significant past history includes hypertension, history of CVA.  He has taken Zyrtec for his allergy symptoms.  The history is provided by the patient.       Home Medications Prior to Admission medications   Medication Sig Start Date End Date Taking? Authorizing Provider  amLODipine (NORVASC) 10 MG tablet Take 10 mg by mouth daily. 01/04/21  Yes [provider]  atorvastatin (LIPITOR) 40 MG tablet Take 40 mg by mouth daily. 11/30/20  Yes [provider]  benzonatate (TESSALON) 100 MG capsule Take 2 capsules (200 mg total) by mouth 3 (three) times daily as needed. 08/19/23  Yes Teletha Petrea, Raynelle Fanning, PA-C  losartan (COZAAR) 50 MG tablet Take 50 mg by mouth daily. 03/28/22  Yes [provider]  rosuvastatin (CRESTOR) 20 MG tablet Take 20 mg by mouth daily. 05/16/23  Yes [provider]  sildenafil (VIAGRA) 50 MG tablet Take 50 mg by mouth as needed. 08/10/20  Yes [provider]  torsemide (DEMADEX) 20 MG tablet Take 20 mg by mouth daily. 02/04/21  Yes [provider]  calcitRIOL (ROCALTROL) 0.25 MCG capsule Take 0.25 mcg by mouth daily.    [provider]  cyanocobalamin (VITAMIN B12) 1000 MCG tablet Take 1,000 mcg by mouth daily.    [provider]  docusate calcium (SURFAK) 240 MG capsule Take 240 mg by mouth daily.    [provider]  folic acid (FOLVITE) 1 MG tablet Take 1 mg by mouth daily.    [provider]  omega-3 acid ethyl esters (LOVAZA) 1 g capsule Take 2 capsules by mouth 2 (two) times daily.    [provider]      Allergies    Patient has no known allergies.    Review of Systems   Review of Systems  Constitutional:  Negative for fever.  HENT:  Positive for rhinorrhea and sneezing. Negative for congestion and sore throat.   Eyes:  Positive for itching.  Respiratory:  Positive for cough and chest tightness. Negative for shortness of breath.   Cardiovascular:  Negative for chest pain.  Gastrointestinal:  Negative for abdominal pain, nausea and vomiting.  Genitourinary: Negative.   Musculoskeletal:  Negative for arthralgias, joint swelling and neck pain.  Skin: Negative.  Negative for rash and wound.  Neurological:  Negative for dizziness, weakness, light-headedness, numbness and headaches.  Psychiatric/Behavioral: Negative.      Physical Exam Updated Vital Signs BP 112/75   Pulse 85   Temp 99.3 F (37.4 C) (Oral)   Resp 19   Ht 5\' 9"  (1.753 m)   Wt 111.6 kg   SpO2 98%   BMI 36.33 kg/m  Physical Exam Vitals and nursing note reviewed.  Constitutional:      Appearance: He is well-developed.  HENT:     Head: Normocephalic and atraumatic.     Nose: Rhinorrhea present.  Eyes:     Conjunctiva/sclera: Conjunctivae normal.  Cardiovascular:     Rate and Rhythm: Normal rate and regular rhythm.     Heart sounds: Normal heart sounds.  Pulmonary:     Effort: Pulmonary effort is normal.     Breath sounds: Normal breath sounds. No wheezing, rhonchi or rales.  Abdominal:     General: Bowel sounds are normal.     Palpations: Abdomen is soft.     Tenderness: There is no abdominal tenderness.  Musculoskeletal:        General: Normal range of motion.     Cervical back:  Normal range of motion.     Right lower leg: No edema.     Left lower leg: No edema.  Skin:    General: Skin is warm and dry.  Neurological:     Mental Status: He is alert.     ED Results / Procedures / Treatments   Labs (all labs ordered are listed, but only abnormal results are displayed) Labs Reviewed  CBC WITH DIFFERENTIAL/PLATELET - Abnormal; Notable for the following components:      Result Value   RBC 3.28 (*)    Hemoglobin 8.8 (*)    HCT 29.1 (*)    Eosinophils Absolute 0.6 (*)    All other components within normal limits  COMPREHENSIVE METABOLIC PANEL WITH GFR - Abnormal; Notable for the following components:   Glucose, Bld 102 (*)    BUN 72 (*)    Creatinine, Ser 13.64 (*)    Calcium 8.8 (*)    Albumin 3.3 (*)    GFR, Estimated 4 (*)    Anion gap 16 (*)    All other components within normal limits  RESP PANEL BY RT-PCR (RSV, FLU A&B, COVID)  RVPGX2  TROPONIN I (HIGH SENSITIVITY)  TROPONIN I (HIGH SENSITIVITY)    EKG EKG Interpretation Date/Time:  Monday August 19 2023 18:33:13 EDT Ventricular Rate:  88 PR Interval:  172 QRS Duration:  80 QT Interval:  356 QTC Calculation: 430 R Axis:   68  Text Interpretation: Normal sinus rhythm Normal ECG When compared with ECG of 27-Apr-2020 08:46, T wave inversion no longer evident in Inferior leads Nonspecific T wave abnormality, improved in Lateral leads Confirmed by Alvino Blood ([id number]) on 08/19/2023 10:20:00 PM  Radiology DG Chest 2 View Result Date: 08/19/2023 CLINICAL DATA:  Shortness of breath. EXAM: CHEST - 2 VIEW COMPARISON:  Chest radiograph dated 06/26/2022. FINDINGS: The heart size and mediastinal contours are within normal limits. Both lungs are clear. The visualized skeletal structures are unremarkable. IMPRESSION: No active cardiopulmonary disease. Electronically Signed   By: Elgie Collard M.D.   On: 08/19/2023 18:45    Procedures Procedures    Medications Ordered in ED Medications   benzonatate (TESSALON) capsule 200 mg (200 mg Oral Given 08/19/23 2218)    ED Course/ Medical Decision Making/ A&P                                 Medical Decision Making Patient presenting with multiple symptoms including itchy eyes, rhinorrhea, nonproductive cough but midsternal burning chest pain triggered by cough suggesting bronchitis type picture probably in association with seasonal allergy.  He is already on Zyrtec.  He is in no respiratory distress and has no clinical symptoms or signs of fluid overload.  He is a successful home peritoneal dialysis patient, no fevers, no abdominal pain.  Tessalon is given and a prescription for additional Tessalon provided.  Differential including pneumonia, but negative chest x-ray here, viral infection, negative respiratory panel.  Amount and/or Complexity of Data Reviewed Labs: ordered.    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    Details: Chest x-ray is clear, no pneumonia, no effusion or interstitial edema. ECG/medicine tests: ordered.    Details: Normal sinus rhythm with rate of 88.  Risk Prescription drug management.           Final Clinical Impression(s) / ED Diagnoses Final diagnoses:  Acute cough  Pollen allergy    Rx / DC Orders ED Discharge Orders          Ordered    benzonatate (TESSALON) 100 MG capsule  3 times daily PRN        08/19/23 2221              Burgess Amor, PA-C 08/19/23 2222    Lonell Grandchild, MD 08/19/23 [phone]

## 2023-08-19 NOTE — ED Triage Notes (Signed)
 Pt arrived via POV c/o cough, congestion, non-productive cough, weakness and chest discomfort X 3 days.

## 2023-08-19 NOTE — Discharge Instructions (Signed)
 You may continue using your Zyrtec for your itchy eyes and runny nose, I have added Tessalon which should help you with your cough as well.  Plan recheck by your primary doctor if your symptoms are not improved with these medications.  Your labs, exam, chest x-ray and EKG are reassuring today.

## 2024-03-06 NOTE — Telephone Encounter (Signed)
 Left VM message for Dominic Henry for wait list check in and reminded him to be sure and send in his monthly red top tube.  Provided my direct number for return call.
